# Patient Record
Sex: Female | Born: 1985 | Race: Black or African American | Hispanic: No | Marital: Single | Smoking: Current every day smoker
Health system: Southern US, Community
[De-identification: ages and names within clinical notes are randomized; demographics above are authoritative.]

## PROBLEM LIST (undated history)

## (undated) DIAGNOSIS — K529 Noninfective gastroenteritis and colitis, unspecified: Secondary | ICD-10-CM

---

## 2015-02-09 ENCOUNTER — Emergency Department (HOSPITAL_COMMUNITY)
Admission: EM | Admit: 2015-02-09 | Discharge: 2015-02-09 | Discharge status: Against Medical Advice | Payer: Medicaid Other | Attending: Emergency Medicine | Admitting: Emergency Medicine

## 2015-02-09 ENCOUNTER — Encounter (HOSPITAL_COMMUNITY): Payer: Self-pay | Admitting: Emergency Medicine

## 2015-02-09 DIAGNOSIS — R51 Headache: Secondary | ICD-10-CM | POA: Insufficient documentation

## 2015-02-09 DIAGNOSIS — Z720 Tobacco use: Secondary | ICD-10-CM | POA: Insufficient documentation

## 2015-02-09 LAB — CBC WITH DIFFERENTIAL/PLATELET
Basophils Absolute: 0 10*3/uL (ref 0.0–0.1)
Basophils Relative: 0 % (ref 0–1)
EOS ABS: 0.2 10*3/uL (ref 0.0–0.7)
EOS PCT: 2 % (ref 0–5)
HEMATOCRIT: 36.3 % (ref 36.0–46.0)
Hemoglobin: 12.6 g/dL (ref 12.0–15.0)
LYMPHS ABS: 2.7 10*3/uL (ref 0.7–4.0)
LYMPHS PCT: 36 % (ref 12–46)
MCH: 31.8 pg (ref 26.0–34.0)
MCHC: 34.7 g/dL (ref 30.0–36.0)
MCV: 91.7 fL (ref 78.0–100.0)
MONOS PCT: 5 % (ref 3–12)
Monocytes Absolute: 0.4 10*3/uL (ref 0.1–1.0)
Neutro Abs: 4.2 10*3/uL (ref 1.7–7.7)
Neutrophils Relative %: 57 % (ref 43–77)
Platelets: 242 10*3/uL (ref 150–400)
RBC: 3.96 MIL/uL (ref 3.87–5.11)
RDW: 12.2 % (ref 11.5–15.5)
WBC: 7.4 10*3/uL (ref 4.0–10.5)

## 2015-02-09 LAB — COMPREHENSIVE METABOLIC PANEL
ALT: 11 U/L — AB (ref 14–54)
AST: 19 U/L (ref 15–41)
Albumin: 3.5 g/dL (ref 3.5–5.0)
Alkaline Phosphatase: 54 U/L (ref 38–126)
Anion gap: 6 (ref 5–15)
BUN: 17 mg/dL (ref 6–20)
CALCIUM: 9.1 mg/dL (ref 8.9–10.3)
CO2: 26 mmol/L (ref 22–32)
CREATININE: 0.75 mg/dL (ref 0.44–1.00)
Chloride: 106 mmol/L (ref 101–111)
GFR calc Af Amer: >60 mL/min (ref >60)
Glucose, Bld: 119 mg/dL — ABNORMAL HIGH (ref 65–99)
Potassium: 3.7 mmol/L (ref 3.5–5.1)
SODIUM: 138 mmol/L (ref 135–145)
TOTAL PROTEIN: 6.7 g/dL (ref 6.5–8.1)
Total Bilirubin: 0.2 mg/dL — ABNORMAL LOW (ref 0.3–1.2)

## 2015-02-09 LAB — POC URINE PREG, ED: Preg Test, Ur: NEGATIVE

## 2015-02-09 NOTE — ED Notes (Signed)
Pt c/o headache x's 1 week with nausea no vomiting.  St's has taken Advil without relief

## 2015-05-05 ENCOUNTER — Encounter (HOSPITAL_COMMUNITY): Payer: Self-pay | Admitting: Emergency Medicine

## 2015-05-05 ENCOUNTER — Emergency Department (HOSPITAL_COMMUNITY)
Admission: EM | Admit: 2015-05-05 | Discharge: 2015-05-06 | Disposition: A | Discharge status: Home / Self Care | Payer: Medicaid Other | Attending: Emergency Medicine | Admitting: Emergency Medicine

## 2015-05-05 ENCOUNTER — Emergency Department (HOSPITAL_COMMUNITY): Payer: Medicaid Other

## 2015-05-05 DIAGNOSIS — R1013 Epigastric pain: Secondary | ICD-10-CM | POA: Insufficient documentation

## 2015-05-05 DIAGNOSIS — Z720 Tobacco use: Secondary | ICD-10-CM | POA: Diagnosis not present

## 2015-05-05 DIAGNOSIS — R0789 Other chest pain: Secondary | ICD-10-CM

## 2015-05-05 DIAGNOSIS — R079 Chest pain, unspecified: Secondary | ICD-10-CM | POA: Diagnosis present

## 2015-05-05 LAB — BASIC METABOLIC PANEL
ANION GAP: 8 (ref 5–15)
BUN: 13 mg/dL (ref 6–20)
CALCIUM: 9.8 mg/dL (ref 8.9–10.3)
CO2: 26 mmol/L (ref 22–32)
Chloride: 107 mmol/L (ref 101–111)
Creatinine, Ser: 0.83 mg/dL (ref 0.44–1.00)
GFR calc Af Amer: >60 mL/min (ref >60)
GFR calc non Af Amer: >60 mL/min (ref >60)
Glucose, Bld: 88 mg/dL (ref 65–99)
Potassium: 3.7 mmol/L (ref 3.5–5.1)
Sodium: 141 mmol/L (ref 135–145)

## 2015-05-05 LAB — I-STAT TROPONIN, ED: TROPONIN I, POC: 0 ng/mL (ref 0.00–0.08)

## 2015-05-05 LAB — CBC
HEMATOCRIT: 39.2 % (ref 36.0–46.0)
Hemoglobin: 13.4 g/dL (ref 12.0–15.0)
MCH: 32.2 pg (ref 26.0–34.0)
MCHC: 34.2 g/dL (ref 30.0–36.0)
MCV: 94.2 fL (ref 78.0–100.0)
Platelets: 211 10*3/uL (ref 150–400)
RBC: 4.16 MIL/uL (ref 3.87–5.11)
RDW: 12.2 % (ref 11.5–15.5)
WBC: 10.6 10*3/uL — ABNORMAL HIGH (ref 4.0–10.5)

## 2015-05-05 MED ORDER — KETOROLAC TROMETHAMINE 30 MG/ML IJ SOLN
30.0000 mg | Freq: Once | INTRAMUSCULAR | Status: AC
  Administered 2015-05-05: 30 mg via INTRAMUSCULAR
  Filled 2015-05-05: qty 1

## 2015-05-05 MED ORDER — GI COCKTAIL ~~LOC~~
30.0000 mL | Freq: Once | ORAL | Status: AC
  Administered 2015-05-05: 30 mL via ORAL
  Filled 2015-05-05: qty 30

## 2015-05-05 NOTE — ED Notes (Signed)
Pt. reports central chest pain " gurgling" onset today with mild SOB , denies nausea or diaphoresis . No cough or congestion , denies fever or chills.

## 2015-05-05 NOTE — ED Provider Notes (Signed)
CSN: [id number]     Arrival date & time 05/05/15  2136 History   First MD Initiated Contact with Patient 05/05/15 2303     Chief Complaint  Patient presents with  . Chest Pain     (Consider location/radiation/quality/duration/timing/severity/associated sxs/prior Treatment) HPI Comments: Pt. reports central chest pain " gurgling" onset today with mild SOB , denies nausea or diaphoresis . No cough or congestion , denies fever or chills. PERC negative. No early familial cardiac history.   Patient is a 29 y.o. female presenting with chest pain.  Chest Pain Pain location:  Epigastric Pain quality: tightness   Pain quality comment:  Gurgling Pain radiates to:  Does not radiate Pain radiates to the back: no   Timing:  Intermittent Progression:  Waxing and waning Chronicity:  New Relieved by:  None tried Worsened by:  Nothing tried Ineffective treatments:  None tried Associated symptoms: no abdominal pain, no fever, no lower extremity edema, no nausea, no syncope and not vomiting     History reviewed. No pertinent past medical history. No past surgical history on file. No family history on file. Social History  Substance Use Topics  . Smoking status: Current Every Day Smoker  . Smokeless tobacco: None  . Alcohol Use: Yes   OB History    No data available     Review of Systems  Constitutional: Negative for fever.  Cardiovascular: Positive for chest pain. Negative for syncope.  Gastrointestinal: Negative for nausea, vomiting and abdominal pain.  All other systems reviewed and are negative.     Allergies  Review of patient's allergies indicates no known allergies.  Home Medications   Prior to Admission medications   Medication Sig Start Date End Date Taking? Authorizing Minela [place]  ibuprofen (ADVIL,MOTRIN) 200 MG tablet Take 400-800 mg by mouth every 6 (six) hours as needed for headache or mild pain.   Yes Historical Elenora Hawbaker, MD  omeprazole (PRILOSEC) 20 MG capsule Take  1 capsule (20 mg total) by mouth daily. 05/06/15   Jennifer Piepenbrink, PA-C   BP 111/69 mmHg  Pulse 62  Temp(Src) 98 F (36.7 C) (Oral)  Resp 13  Ht  (1.803 m)  Wt 170 lb (77.111 kg)  BMI 23.72 kg/m2  SpO2 100%  LMP 03/31/2015 Physical Exam  Constitutional: She is oriented to person, place, and time. She appears well-developed and well-nourished. No distress.  HENT:  Head: Normocephalic and atraumatic.  Right Ear: External ear normal.  Left Ear: External ear normal.  Nose: Nose normal.  Mouth/Throat: Oropharynx is clear and moist.  Eyes: Conjunctivae are normal.  Neck: Normal range of motion. Neck supple.  No nuchal rigidity.   Cardiovascular: Normal rate, regular rhythm and normal heart sounds.   Pulmonary/Chest: Effort normal and breath sounds normal. No respiratory distress. She exhibits no tenderness.  Abdominal: Soft. There is no tenderness.  Musculoskeletal: Normal range of motion. She exhibits no edema.  Neurological: She is alert and oriented to person, place, and time.  Skin: Skin is warm and dry. She is not diaphoretic.  Psychiatric: She has a normal mood and affect.  Nursing note and vitals reviewed.   ED Course  Procedures (including critical care time) Medications  gi cocktail (Maalox,Lidocaine,Donnatal) (30 mLs Oral Given 05/05/15 2339)  ketorolac (TORADOL) 30 MG/ML injection 30 mg (30 mg Intramuscular Given 05/05/15 2340)    Labs Review Labs Reviewed  CBC - Abnormal; Notable for the following:    WBC 10.6 (*)    All other components within normal  limits  BASIC METABOLIC PANEL  I-STAT TROPOININ, ED    Imaging Review Dg Chest 2 View  05/05/2015   CLINICAL DATA:  Patient complains of tightness and a "gurgling" in the center of her chest that began this morning; she states she is unable to take in a full deep breath; smoker x 10 years; h/o bronchitis Pt states she has irregular periods; pt was double shielded  EXAM: CHEST  2 VIEW  COMPARISON:  None.   FINDINGS: The heart size and mediastinal contours are within normal limits. Both lungs are clear. The visualized skeletal structures are unremarkable.  IMPRESSION: No active cardiopulmonary disease.   Electronically Signed   By: Norva Pavlov M.D.   On: 05/05/2015 23:17   I have personally reviewed and evaluated these images and lab results as part of my medical decision-making.   EKG Interpretation   Date/Time:  Wednesday May 05 2015 21:41:54 EDT Ventricular Rate:  83 PR Interval:  142 QRS Duration: 80 QT Interval:  360 QTC Calculation: 423 R Axis:   85 Text Interpretation:  Normal sinus rhythm with sinus arrhythmia Normal ECG  No previous ECGs available Confirmed by YAO  MD, DAVID ([id number]) on  05/05/2015 10:49:51 PM      MDM   Final diagnoses:  Chest pain, atypical   Filed Vitals:   05/05/15 2330  BP: 111/69  Pulse: 62  Temp:   Resp: 13   Afebrile, NAD, non-toxic appearing, AAOx4.   Patient is to be discharged with recommendation to follow up with PCP in regards to today's hospital visit. Chest pain is not likely of cardiac or pulmonary etiology d/t presentation, perc negative, VSS, no tracheal deviation, no JVD or new murmur, RRR, breath sounds equal bilaterally, EKG without acute abnormalities, negative troponin, and negative CXR. Pt has been advised start a PPI and return to the ED is CP becomes exertional, associated with diaphoresis or nausea, radiates to left jaw/arm, worsens or becomes concerning in any way. Pt appears reliable for follow up and is agreeable to discharge.   Case has been discussed with  Dr. Jeraldine Loots who agrees with the above plan to discharge.       Francee Piccolo, PA-C 05/06/15 0109  Gerhard Munch, MD 05/06/15 [phone]

## 2015-05-06 MED ORDER — OMEPRAZOLE 20 MG PO CPDR: 20.0000 mg | DELAYED_RELEASE_CAPSULE | Freq: Every day | ORAL | Status: DC

## 2015-05-06 NOTE — ED Notes (Signed)
Pt. Left with all belongings and refused wheelchair. Discharge instructions were reviewed and all questions were answered.  

## 2015-05-06 NOTE — Discharge Instructions (Signed)
Please follow up with your primary care physician in 1-2 days. If you do not have one please call the [facility] number listed above. Please read all discharge instructions and return precautions.  ° ° °Chest Pain (Nonspecific) °It is often hard to give a specific diagnosis for the cause of chest pain. There is always a chance that your pain could be related to something serious, such as a heart attack or a blood clot in the lungs. You need to follow up with your health care provider for further evaluation. °CAUSES  °· Heartburn. °· Pneumonia or bronchitis. °· Anxiety or stress. °· Inflammation around your heart (pericarditis) or lung (pleuritis or pleurisy). °· A blood clot in the lung. °· A collapsed lung (pneumothorax). It can develop suddenly on its own (spontaneous pneumothorax) or from trauma to the chest. °· Shingles infection (herpes zoster virus). °The chest wall is composed of bones, muscles, and cartilage. Any of these can be the source of the pain. °· The bones can be bruised by injury. °· The muscles or cartilage can be strained by coughing or overwork. °· The cartilage can be affected by inflammation and become sore (costochondritis). °DIAGNOSIS  °Lab tests or other studies may be needed to find the cause of your pain. Your health care provider may have you take a test called an ambulatory electrocardiogram (ECG). An ECG records your heartbeat patterns over a 24-hour period. You may also have other tests, such as: °· Transthoracic echocardiogram (TTE). During echocardiography, sound waves are used to evaluate how blood flows through your heart. °· Transesophageal echocardiogram (TEE). °· Cardiac monitoring. This allows your health care provider to monitor your heart rate and rhythm in real time. °· Holter monitor. This is a portable device that records your heartbeat and can help diagnose heart arrhythmias. It allows your health care provider to track your heart activity for  several days, if needed. °· Stress tests by exercise or by giving medicine that makes the heart beat faster. °TREATMENT  °· Treatment depends on what may be causing your chest pain. Treatment may include: °¨ Acid blockers for heartburn. °¨ Anti-inflammatory medicine. °¨ Pain medicine for inflammatory conditions. °¨ Antibiotics if an infection is present. °· You may be advised to change lifestyle habits. This includes stopping smoking and avoiding alcohol, caffeine, and chocolate. °· You may be advised to keep your head raised (elevated) when sleeping. This reduces the chance of acid going backward from your stomach into your esophagus. °Most of the time, nonspecific chest pain will improve within 2-3 days with rest and mild pain medicine.  °HOME CARE INSTRUCTIONS  °· If antibiotics were prescribed, take them as directed. Finish them even if you start to feel better. °· For the next few days, avoid physical activities that bring on chest pain. Continue physical activities as directed. °· Do not use any tobacco products, including cigarettes, chewing tobacco, or electronic cigarettes. °· Avoid drinking alcohol. °· Only take medicine as directed by your health care provider. °· Follow your health care provider's suggestions for further testing if your chest pain does not go away. °· Keep any follow-up appointments you made. If you do not go to an appointment, you could develop lasting (chronic) problems with pain. If there is any problem keeping an appointment, call to reschedule. °SEEK MEDICAL CARE IF:  °· Your chest pain does not go away, even after treatment. °· You have a rash with blisters on your chest. °· You have a fever. °  SEEK IMMEDIATE MEDICAL CARE IF:  °· You have increased chest pain or pain that spreads to your arm, neck, jaw, back, or abdomen. °· You have shortness of breath. °· You have an increasing cough, or you cough up blood. °· You have severe back or abdominal pain. °· You feel nauseous or  vomit. °· You have severe weakness. °· You faint. °· You have chills. °This is an emergency. Do not wait to see if the pain will go away. Get medical help at once. Call your local emergency services (911 in [place] Do not drive yourself to the hospital. °MAKE SURE YOU:  °· Understand these instructions. °· Will watch your condition. °· Will get help right away if you are not doing well or get worse. °Document Released: 05/10/2005 Document Revised: 08/05/2013 Document Reviewed: 03/05/2008 °ExitCare® Patient Information ©2015 [employer]. This information is not intended to replace advice given to you by your health care provider. Make sure you discuss any questions you have with your health care provider. ° °

## 2015-12-10 ENCOUNTER — Encounter: Payer: Self-pay | Admitting: Podiatry

## 2015-12-10 ENCOUNTER — Ambulatory Visit (INDEPENDENT_AMBULATORY_CARE_PROVIDER_SITE_OTHER): Payer: Medicaid Other | Admitting: Podiatry

## 2015-12-10 VITALS — BP 111/70 | HR 76 | Resp 12

## 2015-12-10 DIAGNOSIS — S91209A Unspecified open wound of unspecified toe(s) with damage to nail, initial encounter: Secondary | ICD-10-CM

## 2015-12-10 DIAGNOSIS — S91301A Unspecified open wound, right foot, initial encounter: Secondary | ICD-10-CM

## 2015-12-10 NOTE — Progress Notes (Signed)
   Subjective:    Patient ID: Amy Case, female    DOB: 07-03-1986, 30 y.o.   MRN: [id number]  HPI this patient presents to the office with chief complaint of a deformed toenail on her right  foot. She says she was receiving treatment at a nail salon and following the treatment, the nail became discolored and has fallen off of her nail bed. She says it has regrown and become thick disfigured and she has even trimmed it herself. She is concerned about the fact the nail is unattached and does not appear normal. She presents the office today for an evaluation and treatment of this condition    Review of Systems  All other systems reviewed and are negative.      Objective:   Physical Exam GENERAL APPEARANCE: Alert, conversant. Appropriately groomed. No acute distress.  VASCULAR: Pedal pulses are  palpable at  [facility] bilateral.  Capillary refill time is immediate to all digits,  Normal temperature gradient.  Digital hair growth is present bilateral  NEUROLOGIC: sensation is normal to 5.07 monofilament at 5/5 sites bilateral.  Light touch is intact bilateral, Muscle strength normal.  MUSCULOSKELETAL: acceptable muscle strength, tone and stability bilateral.  Intrinsic muscluature intact bilateral.  Rectus appearance of foot and digits noted bilateral. Hammer toe with heloma durum right foot.   DERMATOLOGIC: skin color, texture, and turgor are within normal limits.  No preulcerative lesions or ulcers  are seen, no interdigital maceration noted.  No open lesions present.  Digital nails are asymptomatic. No drainage noted. Thick disfigured discolored unattached nail plate right hallux.         Assessment & Plan:  Nail trauma right hallux.   IE.  Discussed nail problem with patient.   To return prn   Helane GuntherGregory Mayer DPM

## 2016-02-05 ENCOUNTER — Encounter (HOSPITAL_COMMUNITY): Payer: Self-pay

## 2016-02-05 ENCOUNTER — Emergency Department (HOSPITAL_COMMUNITY)
Admission: EM | Admit: 2016-02-05 | Discharge: 2016-02-05 | Disposition: A | Discharge status: Home / Self Care | Payer: Medicaid Other | Attending: Emergency Medicine | Admitting: Emergency Medicine

## 2016-02-05 DIAGNOSIS — N390 Urinary tract infection, site not specified: Secondary | ICD-10-CM | POA: Diagnosis present

## 2016-02-05 DIAGNOSIS — F17200 Nicotine dependence, unspecified, uncomplicated: Secondary | ICD-10-CM | POA: Insufficient documentation

## 2016-02-05 DIAGNOSIS — B9689 Other specified bacterial agents as the cause of diseases classified elsewhere: Secondary | ICD-10-CM

## 2016-02-05 DIAGNOSIS — Z79899 Other long term (current) drug therapy: Secondary | ICD-10-CM | POA: Diagnosis not present

## 2016-02-05 DIAGNOSIS — N760 Acute vaginitis: Secondary | ICD-10-CM | POA: Diagnosis not present

## 2016-02-05 LAB — URINALYSIS, ROUTINE W REFLEX MICROSCOPIC
Bilirubin Urine: NEGATIVE
Glucose, UA: NEGATIVE mg/dL
Hgb urine dipstick: NEGATIVE
KETONES UR: NEGATIVE mg/dL
Nitrite: NEGATIVE
Protein, ur: NEGATIVE mg/dL
Specific Gravity, Urine: 1.034 — ABNORMAL HIGH (ref 1.005–1.030)
pH: 6 (ref 5.0–8.0)

## 2016-02-05 LAB — URINE MICROSCOPIC-ADD ON

## 2016-02-05 LAB — WET PREP, GENITAL
Sperm: NONE SEEN
TRICH WET PREP: NONE SEEN
YEAST WET PREP: NONE SEEN

## 2016-02-05 LAB — PREGNANCY, URINE: Preg Test, Ur: NEGATIVE

## 2016-02-05 MED ORDER — METRONIDAZOLE 500 MG PO TABS: 500.0000 mg | ORAL_TABLET | Freq: Two times a day (BID) | ORAL | Status: DC

## 2016-02-05 MED ORDER — NITROFURANTOIN MONOHYD MACRO 100 MG PO CAPS: 100.0000 mg | ORAL_CAPSULE | Freq: Two times a day (BID) | ORAL | Status: DC

## 2016-02-05 NOTE — ED Notes (Signed)
Pt states she believes she has UTI; Pt states symptoms started about a week ago. Pt denies pain on arrival. Pt a&ox 4 on arrival.

## 2016-02-05 NOTE — ED Provider Notes (Signed)
CSN: [id number]     Arrival date & time 02/05/16  2024 History   First MD Initiated Contact with Patient 02/05/16 2029     No chief complaint on file.    (Consider location/radiation/quality/duration/timing/severity/associated sxs/prior Treatment) HPI  30 year old female presents with urinary frequency over the past 1 week. No dysuria. No urgency. No abdominal pain or back pain. No nausea, vomiting, or fevers. Has not had an increase in discharge but has noticed that her discharge has an odor to it during this time. No missed periods.  No past medical history on file. No past surgical history on file. No family history on file. Social History  Substance Use Topics  . Smoking status: Current Every Day Smoker  . Smokeless tobacco: Not on file  . Alcohol Use: Yes   OB History    No data available     Review of Systems  Constitutional: Negative for fever.  Gastrointestinal: Negative for nausea, vomiting and abdominal pain.  Genitourinary: Positive for frequency and vaginal discharge. Negative for dysuria.  Musculoskeletal: Negative for back pain.  All other systems reviewed and are negative.     Allergies  Review of patient's allergies indicates no known allergies.  Home Medications   Prior to Admission medications   Medication Sig Start Date End Date Taking? Authorizing Provider  ibuprofen (ADVIL,MOTRIN) 200 MG tablet Take 400-800 mg by mouth every 6 (six) hours as needed for headache or mild pain.    Historical Provider, MD  omeprazole (PRILOSEC) 20 MG capsule Take 1 capsule (20 mg total) by mouth daily. 05/06/15   Francee PiccoloJennifer Piepenbrink, PA-C   There were no vitals taken for this visit. Physical Exam  Constitutional: She is oriented to person, place, and time. She appears well-developed and well-nourished. No distress.  HENT:  Head: Normocephalic and atraumatic.  Right Ear: External ear normal.  Left Ear: External ear normal.  Nose: Nose normal.  Eyes: Right eye  exhibits no discharge. Left eye exhibits no discharge.  Cardiovascular: Normal rate, regular rhythm and normal heart sounds.   Pulmonary/Chest: Effort normal and breath sounds normal.  Abdominal: Soft. She exhibits no distension. There is no tenderness. There is no CVA tenderness.  Genitourinary: Uterus is not enlarged and not tender. Cervix exhibits no motion tenderness. Right adnexum displays no mass and no tenderness. Left adnexum displays no mass and no tenderness. No bleeding in the vagina. Vaginal discharge (scant) found.  Neurological: She is alert and oriented to person, place, and time.  Skin: Skin is warm and dry. She is not diaphoretic.  Nursing note and vitals reviewed.   ED Course  Procedures (including critical care time) Labs Review Labs Reviewed  WET PREP, GENITAL - Abnormal; Notable for the following:    Clue Cells Wet Prep HPF POC PRESENT (*)    WBC, Wet Prep HPF POC MANY (*)    All other components within normal limits  URINALYSIS, ROUTINE W REFLEX MICROSCOPIC (NOT AT [facility]) - Abnormal; Notable for the following:    APPearance CLOUDY (*)    Specific Gravity, Urine 1.034 (*)    Leukocytes, UA SMALL (*)    All other components within normal limits  URINE MICROSCOPIC-ADD ON - Abnormal; Notable for the following:    Squamous Epithelial / LPF 6-30 (*)    Bacteria, UA RARE (*)    All other components within normal limits  PREGNANCY, URINE  POC URINE PREG, ED  GC/CHLAMYDIA PROBE AMP ([place]) NOT AT [facility]    Imaging Review No  results found. I have personally reviewed and evaluated these images and lab results as part of my medical decision-making.   EKG Interpretation None      MDM   Final diagnoses:  Acute lower UTI  Bacterial vaginosis    Patient with symptoms c/w UTI, will treat with macrobid. No signs/symptoms of more severe disease or sepsis. Has some discharge with change in odor, will treat for possible BV. She has no concern for STI, thus will not  treat for GC/Chlamydia and send for PCR. Discussed return precautions.    Pricilla LovelessScott Dequavius Kuhner, MD 02/06/16 0000

## 2016-02-05 NOTE — ED Notes (Signed)
Pelvic cart at bedside. 

## 2016-02-05 NOTE — ED Notes (Signed)
MD at bedside. 

## 2016-02-07 LAB — GC/CHLAMYDIA PROBE AMP (~~LOC~~) NOT AT ARMC
Chlamydia: POSITIVE — AB
Neisseria Gonorrhea: NEGATIVE

## 2016-02-08 ENCOUNTER — Telehealth (HOSPITAL_BASED_OUTPATIENT_CLINIC_OR_DEPARTMENT_OTHER): Payer: Self-pay | Admitting: Emergency Medicine

## 2016-02-08 NOTE — Telephone Encounter (Signed)
Chart handoff to EDP for treatment plan for + Chlamydia 

## 2016-02-10 ENCOUNTER — Telehealth (HOSPITAL_BASED_OUTPATIENT_CLINIC_OR_DEPARTMENT_OTHER): Payer: Self-pay | Admitting: Emergency Medicine

## 2016-02-10 NOTE — Telephone Encounter (Signed)
Post ED Visit - Positive Culture Follow-up: Successful Patient Follow-Up  Culture assessed and recommendations reviewed by: []  Enzo BiNathan Batchelder, Pharm.D. []  Celedonio MiyamotoJeremy Frens, Pharm.D., BCPS []  Garvin FilaMike Maccia, Pharm.D. []  Georgina PillionElizabeth Martin, [address]Ph[address]D., BCPS []  WitheeMinh Pham, [address]Pharm[address]D., BCPS, AAHIVP []  Estella HuskMichelle Turner, Pharm.D., BCPS, AAHIVP []  Tennis Mustassie Stewart, Pharm.D. []  Sherle Poeob Vincent, [address]Ph[address]  Positive chlamydia culture  [x]  Patient discharged without antimicrobial prescription and treatment is now indicated []  Organism is resistant to prescribed ED discharge antimicrobial []  Patient with positive blood cultures  Changes discussed with ED provider: Eyvonne MechanicJeffrey Hedges PA New antibiotic prescription Zithromax 1 gram po x 1 dose [facility]   Berle MullMiller, Fedor Kazmierski 02/10/2016, 3:59 PM

## 2016-09-19 ENCOUNTER — Ambulatory Visit (HOSPITAL_COMMUNITY)
Admission: EM | Admit: 2016-09-19 | Discharge: 2016-09-19 | Disposition: A | Discharge status: Home / Self Care | Payer: Medicaid Other | Attending: Family Medicine | Admitting: Family Medicine

## 2016-09-19 ENCOUNTER — Ambulatory Visit (INDEPENDENT_AMBULATORY_CARE_PROVIDER_SITE_OTHER): Payer: Medicaid Other

## 2016-09-19 DIAGNOSIS — W19XXXA Unspecified fall, initial encounter: Secondary | ICD-10-CM | POA: Diagnosis not present

## 2016-09-19 DIAGNOSIS — S40011A Contusion of right shoulder, initial encounter: Secondary | ICD-10-CM | POA: Diagnosis not present

## 2016-09-19 DIAGNOSIS — M7580 Other shoulder lesions, unspecified shoulder: Secondary | ICD-10-CM | POA: Diagnosis not present

## 2016-09-19 MED ORDER — NAPROXEN 500 MG PO TABS: 500.0000 mg | ORAL_TABLET | Freq: Two times a day (BID) | ORAL | 0 refills | Status: DC

## 2016-09-19 NOTE — ED Provider Notes (Signed)
CSN: [id number]     Arrival date & time 09/19/16  1012 History   First MD Initiated Contact with Patient 09/19/16 1116     Chief Complaint  Patient presents with  . Shoulder Injury   (Consider location/radiation/quality/duration/timing/severity/associated sxs/prior Treatment) HPI  No past medical history on file. No past surgical history on file. No family history on file. Social History  Substance Use Topics  . Smoking status: Current Every Day Smoker  . Smokeless tobacco: Not on file  . Alcohol use Yes   OB History    No data available     Review of Systems  Constitutional: Negative.   HENT: Negative.   Eyes: Negative.   Respiratory: Negative.   Cardiovascular: Negative.   Gastrointestinal: Negative.   Endocrine: Negative.   Genitourinary: Negative.   Musculoskeletal: Positive for arthralgias.  Neurological: Negative.     Allergies  Patient has no known allergies.  Home Medications   Prior to Admission medications   Medication Sig Start Date End Date Taking? Authorizing Provider  ibuprofen (ADVIL,MOTRIN) 200 MG tablet Take 400-800 mg by mouth every 6 (six) hours as needed for headache or mild pain.    Historical Provider, MD  metroNIDAZOLE (FLAGYL) 500 MG tablet Take 1 tablet (500 mg total) by mouth 2 (two) times daily. One po bid x 7 days 02/05/16   Pricilla LovelessScott Goldston, MD  naproxen (NAPROSYN) 500 MG tablet Take 1 tablet (500 mg total) by mouth 2 (two) times daily with a meal. 09/19/16   Deatra CanterWilliam J Anola Mcgough, FNP  nitrofurantoin, macrocrystal-monohydrate, (MACROBID) 100 MG capsule Take 1 capsule (100 mg total) by mouth 2 (two) times daily. 02/05/16   Pricilla LovelessScott Goldston, MD   Meds Ordered and Administered this Visit  Medications - No data to display  BP 119/77 (BP Location: Left Arm)   Pulse 75   Temp 98.5 F (36.9 C) (Oral)   Resp 16   SpO2 98%  No data found.   Physical Exam  Constitutional: She appears well-developed and well-nourished.  HENT:  Head: Normocephalic  and atraumatic.  Eyes: Conjunctivae and EOM are normal. Pupils are equal, round, and reactive to light.  Neck: Normal range of motion. Neck supple.  Cardiovascular: Normal rate, regular rhythm and normal heart sounds.   Pulmonary/Chest: Effort normal and breath sounds normal.  Musculoskeletal: She exhibits tenderness.  TTP right deltoid.  Tenderness with active /passive rom right shoulder.  No swelling or deformity  Nursing note and vitals reviewed.   Urgent Care Course     Procedures (including critical care time)  Labs Review Labs Reviewed - No data to display  Imaging Review Dg Shoulder Right  Result Date: 09/19/2016 CLINICAL DATA:  Larey SeatFell last night with right shoulder pain EXAM: RIGHT SHOULDER - 2+ VIEW COMPARISON:  None. FINDINGS: The right humeral head is in normal position and the glenohumeral joint space appears normal. The right AC joint is normally aligned. The right ribs that are visualized are intact. IMPRESSION: Negative. Electronically Signed   By: Dwyane DeePaul  Barry M.D.   On: 09/19/2016 11:37     Visual Acuity Review  Right Eye Distance:   Left Eye Distance:   Bilateral Distance:    Right Eye Near:   Left Eye Near:    Bilateral Near:         MDM   1. Contusion of right shoulder, initial encounter   2. Fall, initial encounter   3. Rotator cuff tendinitis, unspecified laterality    Naprosyn 500mg  one po bid x  10 days #20 Sling right arm  Work note 2 days      Deatra Canter, FNP 09/19/16 1226    Anselm Pancoast [place], [place] 09/19/16 1228

## 2016-09-19 NOTE — ED Triage Notes (Signed)
C/o right shoulder pain States she fell out of the bed last night

## 2016-12-26 ENCOUNTER — Emergency Department (HOSPITAL_COMMUNITY)
Admission: EM | Admit: 2016-12-26 | Discharge: 2016-12-26 | Disposition: A | Discharge status: Home / Self Care | Payer: Self-pay | Attending: Emergency Medicine | Admitting: Emergency Medicine

## 2016-12-26 ENCOUNTER — Emergency Department (HOSPITAL_COMMUNITY): Payer: Self-pay

## 2016-12-26 ENCOUNTER — Encounter (HOSPITAL_COMMUNITY): Payer: Self-pay | Admitting: *Deleted

## 2016-12-26 DIAGNOSIS — Z79899 Other long term (current) drug therapy: Secondary | ICD-10-CM | POA: Insufficient documentation

## 2016-12-26 DIAGNOSIS — R42 Dizziness and giddiness: Secondary | ICD-10-CM | POA: Insufficient documentation

## 2016-12-26 DIAGNOSIS — F17200 Nicotine dependence, unspecified, uncomplicated: Secondary | ICD-10-CM | POA: Insufficient documentation

## 2016-12-26 DIAGNOSIS — R519 Headache, unspecified: Secondary | ICD-10-CM

## 2016-12-26 DIAGNOSIS — R51 Headache: Secondary | ICD-10-CM | POA: Insufficient documentation

## 2016-12-26 LAB — CBC
HCT: 36.6 % (ref 36.0–46.0)
HEMOGLOBIN: 12.1 g/dL (ref 12.0–15.0)
MCH: 30.5 pg (ref 26.0–34.0)
MCHC: 33.1 g/dL (ref 30.0–36.0)
MCV: 92.2 fL (ref 78.0–100.0)
PLATELETS: 253 10*3/uL (ref 150–400)
RBC: 3.97 MIL/uL (ref 3.87–5.11)
RDW: 12.6 % (ref 11.5–15.5)
WBC: 7.9 10*3/uL (ref 4.0–10.5)

## 2016-12-26 LAB — I-STAT CHEM 8, ED
BUN: 14 mg/dL (ref 6–20)
CHLORIDE: 106 mmol/L (ref 101–111)
Calcium, Ion: 1.14 mmol/L — ABNORMAL LOW (ref 1.15–1.40)
Creatinine, Ser: 0.7 mg/dL (ref 0.44–1.00)
GLUCOSE: 104 mg/dL — AB (ref 65–99)
HCT: 37 % (ref 36.0–46.0)
HEMOGLOBIN: 12.6 g/dL (ref 12.0–15.0)
POTASSIUM: 3.7 mmol/L (ref 3.5–5.1)
SODIUM: 140 mmol/L (ref 135–145)
TCO2: 25 mmol/L (ref 0–100)

## 2016-12-26 LAB — I-STAT BETA HCG BLOOD, ED (MC, WL, AP ONLY): I-stat hCG, quantitative: <5 m[IU]/mL (ref <5)

## 2016-12-26 MED ORDER — FLUORESCEIN SODIUM 0.6 MG OP STRP
1.0000 | ORAL_STRIP | Freq: Once | OPHTHALMIC | Status: AC
  Administered 2016-12-26: 1 via OPHTHALMIC
  Filled 2016-12-26: qty 1

## 2016-12-26 MED ORDER — SODIUM CHLORIDE 0.9 % IV BOLUS (SEPSIS)
1000.0000 mL | Freq: Once | INTRAVENOUS | Status: AC
Start: 2016-12-26 — End: 2016-12-26
  Administered 2016-12-26: 1000 mL via INTRAVENOUS

## 2016-12-26 MED ORDER — DIPHENHYDRAMINE HCL 50 MG/ML IJ SOLN
50.0000 mg | Freq: Once | INTRAMUSCULAR | Status: AC
Start: 2016-12-26 — End: 2016-12-26
  Administered 2016-12-26: 50 mg via INTRAVENOUS
  Filled 2016-12-26: qty 1

## 2016-12-26 MED ORDER — METOCLOPRAMIDE HCL 5 MG/ML IJ SOLN
10.0000 mg | Freq: Once | INTRAMUSCULAR | Status: AC
  Administered 2016-12-26: 10 mg via INTRAVENOUS
  Filled 2016-12-26: qty 2

## 2016-12-26 MED ORDER — TETRACAINE HCL 0.5 % OP SOLN
2.0000 [drp] | Freq: Once | OPHTHALMIC | Status: AC
  Administered 2016-12-26: 2 [drp] via OPHTHALMIC
  Filled 2016-12-26: qty 2

## 2016-12-26 MED ORDER — METOCLOPRAMIDE HCL 10 MG PO TABS: 10.0000 mg | ORAL_TABLET | Freq: Three times a day (TID) | ORAL | 0 refills | Status: DC | PRN

## 2016-12-26 NOTE — ED Notes (Signed)
Patient transported to CT 

## 2016-12-26 NOTE — ED Notes (Signed)
EKG given to Dr. Yelverton 

## 2016-12-26 NOTE — Discharge Instructions (Signed)
call or return to have any questions, new symptoms, change in symptoms, or symptoms that you  do not understand. ° °

## 2016-12-26 NOTE — ED Triage Notes (Signed)
Pt reports onset at 5:45am of headache with dizziness, blurred vision and nausea.

## 2016-12-26 NOTE — ED Provider Notes (Signed)
[facility]-EMERGENCY DEPT Provider Note   CSN: [id number] Arrival date & time: 12/26/16  0709     History   Chief Complaint Chief Complaint  Patient presents with  . Headache  . Dizziness    HPI Amy Case is a 31 y.o. female.  The history is provided by the patient.  Headache   This is a new problem. The current episode started 3 to 5 hours ago (started around 5:45am). 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 Pain location: frontal, centered behind bilateral eyes. The quality of the pain is described as dull and throbbing. The pain is severe. The pain does not radiate. Associated symptoms include nausea. Pertinent negatives include no fever, no chest pressure, no syncope, no shortness of breath and no vomiting. Associated symptoms comments: "clammy", blurry vision worse over left . She has tried nothing for the symptoms. The treatment provided no relief.    History reviewed. No pertinent past medical history.  There are no active problems to display for this patient.   History reviewed. No pertinent surgical history.  OB History    No data available       Home Medications    Prior to Admission medications   Medication Sig Start Date End Date Taking? Authorizing Provider  acetaminophen (TYLENOL) 325 MG tablet Take 650 mg by mouth every 6 (six) hours as needed for mild pain.   Yes [provider]  cyclobenzaprine (FLEXERIL) 10 MG tablet Take 10 mg by mouth 3 (three) times daily as needed for muscle spasms.   Yes [provider]  ibuprofen (ADVIL,MOTRIN) 200 MG tablet Take 400-800 mg by mouth every 6 (six) hours as needed for headache or mild pain.   Yes [provider]  metoCLOPramide (REGLAN) 10 MG tablet Take 1 tablet (10 mg total) by mouth every 8 (eight) hours as needed for nausea (headache). 12/26/16   Marijean Niemann, MD  metroNIDAZOLE (FLAGYL) 500 MG tablet Take 1 tablet  (500 mg total) by mouth 2 (two) times daily. One po bid x 7 days Patient not taking: Reported on 12/26/2016 02/05/16   Pricilla Loveless, MD  naproxen (NAPROSYN) 500 MG tablet Take 1 tablet (500 mg total) by mouth 2 (two) times daily with a meal. Patient not taking: Reported on 12/26/2016 09/19/16   Deatra Canter, FNP  nitrofurantoin, macrocrystal-monohydrate, (MACROBID) 100 MG capsule Take 1 capsule (100 mg total) by mouth 2 (two) times daily. Patient not taking: Reported on 12/26/2016 02/05/16   Pricilla Loveless, MD    Family History History reviewed. No pertinent family history.  Social History Social History  Substance Use Topics  . Smoking status: Current Every Day Smoker  . Smokeless tobacco: Not on file  . Alcohol use Yes     Allergies   Patient has no known allergies.   Review of Systems Review of Systems  Constitutional: Negative for fever.  HENT: Negative for facial swelling and trouble swallowing.   Eyes: Positive for photophobia and visual disturbance.  Respiratory: Negative for cough and shortness of breath.   Cardiovascular: Negative for chest pain and syncope.  Gastrointestinal: Positive for nausea. Negative for abdominal pain and vomiting.  Genitourinary: Positive for vaginal bleeding (on period now starting 3 days ago).  Musculoskeletal: Negative for neck pain and neck stiffness.  Skin: Negative.   Neurological: Positive for light-headedness and headaches.  All other systems reviewed and are negative.    Physical Exam Updated Vital Signs  BP 112/74   Pulse 79   Temp 98 F (36.7 C) (Oral)   Resp 12   Ht 5\' 11"  (1.803 m)   Wt 77.1 kg   LMP 12/23/2016   SpO2 98%   BMI 23.71 kg/m   Physical Exam  Constitutional: She is oriented to person, place, and time. She appears well-developed and well-nourished. No distress.  HENT:  Head: Normocephalic and atraumatic.  Mouth/Throat: Oropharynx is clear and moist.  Eyes: Conjunctivae and EOM are normal. Pupils are  equal, round, and reactive to light.  Photophobia present with light to both eyes Visual fields intact Left eye pressure 10 and 9 Right eye pressure 11 and 12 No corneal defects on staining  Neck: Normal range of motion. Neck supple.  Cardiovascular: Normal rate, regular rhythm and normal heart sounds.   No murmur heard. Pulmonary/Chest: Effort normal and breath sounds normal. No respiratory distress.  Abdominal: Soft. There is no tenderness. There is no guarding.  Musculoskeletal: She exhibits no edema or deformity.  Neurological: She is alert and oriented to person, place, and time. She has normal strength. No cranial nerve deficit or sensory deficit. Coordination normal. GCS eye subscore is 4. GCS verbal subscore is 5. GCS motor subscore is 6.  Skin: Skin is warm and dry.  Psychiatric: She has a normal mood and affect.  Nursing note and vitals reviewed.    ED Treatments / Results  Labs (all labs ordered are listed, but only abnormal results are displayed) Labs Reviewed  I-STAT CHEM 8, ED - Abnormal; Notable for the following:       Result Value   Glucose, Bld 104 (*)    Calcium, Ion 1.14 (*)    All other components within normal limits  CBC  I-STAT BETA HCG BLOOD, ED ([facility], [facility], [facility] ONLY)    EKG  EKG Interpretation  Date/Time:  Tuesday Dec 26 2016 08:18:31 EDT Ventricular Rate:  74 PR Interval:    QRS Duration: 77 QT Interval:  374 QTC Calculation: 415 R Axis:   83 Text Interpretation:  Sinus arrhythmia ST elev, probable normal early repol pattern Confirmed by YELVERTON  MD, DAVID ([id number]) on 12/26/2016 9:12:41 AM       Radiology Ct Head Wo Contrast  Result Date: 12/26/2016 CLINICAL DATA:  Dizziness and right-sided headache EXAM: CT HEAD WITHOUT CONTRAST TECHNIQUE: Contiguous axial images were obtained from the base of the skull through the vertex without intravenous contrast. COMPARISON:  None. FINDINGS: Brain: The ventricles are normal in size and configuration. There  is no intracranial mass, hemorrhage, extra-axial fluid collection, or midline shift. Gray-white compartments appear normal. No evident acute infarct. Vascular: There is no hyperdense vessel. No abnormal calcification evident. Skull: Bony calvarium appears intact. Sinuses/Orbits: Visualized paranasal sinuses are clear. Note that frontal sinuses are aplastic. Orbits appear symmetric bilaterally. Other: Mastoid air cells are clear. IMPRESSION: Study within normal limits. Electronically Signed   By: Bretta Bang III M.D.   On: 12/26/2016 08:51    Procedures Procedures (including critical care time)  Medications Ordered in ED Medications  sodium chloride 0.9 % bolus 1,000 mL (0 mLs Intravenous Stopped 12/26/16 1008)  diphenhydrAMINE (BENADRYL) injection 50 mg (50 mg Intravenous Given 12/26/16 0830)  metoCLOPramide (REGLAN) injection 10 mg (10 mg Intravenous Given 12/26/16 0831)  tetracaine (PONTOCAINE) 0.5 % ophthalmic solution 2 drop (2 drops Both Eyes Given by Other 12/26/16 0820)  fluorescein ophthalmic strip 1 strip (1 strip Both Eyes Given by Other 12/26/16 0819)  Initial Impression / Assessment and Plan / ED Course  I have reviewed the triage vital signs and the nursing notes.  Pertinent labs & imaging results that were available during my care of the patient were reviewed by me and considered in my medical decision making (see chart for details).     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 No associated chest pain, shortness of breath, but she does report nausea. No history of headaches like this in the past. Not tried anything for improvement. Further history and exam as above with reassuring vitals and no significant finding on neuro exam. Doubt acute infective pathology. Eye exam wnl. Head ct  without acute findings. Ha much improved with treatment here. Pt ambulatory with normal neuro exam and gait on repeat exam.   I have reviewed all imaging. Patient stable for discharge home.  I have reviewed all results with the patient. Advised to cont otc pain medications and will rx reglan. Patient agrees to stated plan. All questions answered. Advised to call or return to have any questions, new symptoms, change in symptoms, or symptoms that they do not understand.   Final Clinical Impressions(s) / ED Diagnoses   Final diagnoses:  Lightheadedness  Acute nonintractable headache, unspecified headache type    New Prescriptions Discharge Medication List as of 12/26/2016  9:58 AM    START taking these medications   Details  metoCLOPramide (REGLAN) 10 MG tablet Take 1 tablet (10 mg total) by mouth every 8 (eight) hours as needed for nausea (headache)., Starting Tue 12/26/2016, Print         Marijean NiemannWoodrum, Kaylan Yates, MD 12/27/16 [id number]    Loren RacerYelverton, David, MD 01/01/17 [phone]

## 2016-12-28 ENCOUNTER — Encounter (HOSPITAL_COMMUNITY): Payer: Self-pay | Admitting: Emergency Medicine

## 2016-12-28 ENCOUNTER — Emergency Department (HOSPITAL_COMMUNITY)
Admission: EM | Admit: 2016-12-28 | Discharge: 2016-12-29 | Disposition: A | Discharge status: Home / Self Care | Payer: Medicaid Other | Attending: Dermatology | Admitting: Dermatology

## 2016-12-28 DIAGNOSIS — R51 Headache: Secondary | ICD-10-CM | POA: Insufficient documentation

## 2016-12-28 DIAGNOSIS — Z5321 Procedure and treatment not carried out due to patient leaving prior to being seen by health care provider: Secondary | ICD-10-CM | POA: Insufficient documentation

## 2016-12-28 NOTE — ED Triage Notes (Signed)
Pt presents to ED for assessment of returning headache from visit on 5/15.  States she had improved symptoms after Reglan and BEnadryl, but headache came back this morning approx 3am, with same left eye blurred vision.  Pt c/o some nausea and light/sound sensitivity.

## 2016-12-29 NOTE — ED Notes (Signed)
Patient did not answer when called for rooming.   

## 2017-07-29 ENCOUNTER — Emergency Department (HOSPITAL_COMMUNITY)
Admission: EM | Admit: 2017-07-29 | Discharge: 2017-07-29 | Disposition: A | Discharge status: Home / Self Care | Payer: Self-pay | Attending: Emergency Medicine | Admitting: Emergency Medicine

## 2017-07-29 ENCOUNTER — Encounter (HOSPITAL_COMMUNITY): Payer: Self-pay | Admitting: Emergency Medicine

## 2017-07-29 ENCOUNTER — Other Ambulatory Visit: Payer: Self-pay

## 2017-07-29 DIAGNOSIS — F17210 Nicotine dependence, cigarettes, uncomplicated: Secondary | ICD-10-CM | POA: Insufficient documentation

## 2017-07-29 DIAGNOSIS — Z79899 Other long term (current) drug therapy: Secondary | ICD-10-CM | POA: Insufficient documentation

## 2017-07-29 DIAGNOSIS — J40 Bronchitis, not specified as acute or chronic: Secondary | ICD-10-CM | POA: Insufficient documentation

## 2017-07-29 MED ORDER — ALBUTEROL SULFATE HFA 108 (90 BASE) MCG/ACT IN AERS
2.0000 | INHALATION_SPRAY | RESPIRATORY_TRACT | Status: DC | PRN
  Administered 2017-07-29: 2 via RESPIRATORY_TRACT
  Filled 2017-07-29 (×2): qty 6.7

## 2017-07-29 MED ORDER — AEROCHAMBER PLUS FLO-VU MEDIUM MISC
1.0000 | Freq: Once | Status: AC
  Administered 2017-07-29: 1
  Filled 2017-07-29: qty 1

## 2017-07-29 NOTE — ED Provider Notes (Signed)
[facility] EMERGENCY DEPARTMENT Provider Note   CSN: [id number] Arrival date & time: 07/29/17  1258     History   Chief Complaint Chief Complaint  Patient presents with  . Chest Pain    HPI Amy Case is a 31 y.o. female.  HPI  31 year old otherwise healthy female smoker presents today with nasal congestion, cough, and cold symptoms that began several days ago.  She has begun to have some increased cough and with this has some associated anterior chest tightness.  This is worse with coughing.  She has some production.  She has a 66 year old daughter that has had similar symptoms.  She has had some subjective fever.  She denies any chills.  She endorses some nausea but has not been vomiting and has been able to take p.o. without difficulty.  She has continued to smoke this is not change her symptoms.  She has had no worsening of symptoms with exertion.  She denies any history of cardiac problems.  She denies any risk factors for DVT or PE.  History reviewed. No pertinent past medical history.  There are no active problems to display for this patient.   No past surgical history on file.  OB History    No data available       Home Medications    Prior to Admission medications   Medication Sig Start Date End Date Taking? Authorizing Provider  acetaminophen (TYLENOL) 325 MG tablet Take 650 mg by mouth every 6 (six) hours as needed for mild pain.    [provider]  cyclobenzaprine (FLEXERIL) 10 MG tablet Take 10 mg by mouth 3 (three) times daily as needed for muscle spasms.    [provider]  ibuprofen (ADVIL,MOTRIN) 200 MG tablet Take 400-800 mg by mouth every 6 (six) hours as needed for headache or mild pain.    [provider]  metoCLOPramide (REGLAN) 10 MG tablet Take 1 tablet (10 mg total) by mouth every 8 (eight) hours as needed for nausea (headache). 12/26/16   Marijean Niemann, MD  metroNIDAZOLE (FLAGYL) 500 MG tablet  Take 1 tablet (500 mg total) by mouth 2 (two) times daily. One po bid x 7 days Patient not taking: Reported on 12/26/2016 02/05/16   Pricilla Loveless, MD  naproxen (NAPROSYN) 500 MG tablet Take 1 tablet (500 mg total) by mouth 2 (two) times daily with a meal. Patient not taking: Reported on 12/26/2016 09/19/16   Deatra Canter, FNP  nitrofurantoin, macrocrystal-monohydrate, (MACROBID) 100 MG capsule Take 1 capsule (100 mg total) by mouth 2 (two) times daily. Patient not taking: Reported on 12/26/2016 02/05/16   Pricilla Loveless, MD    Family History No family history on file.  Social History Social History   Tobacco Use  . Smoking status: Current Every Day Smoker  . Smokeless tobacco: Never Used  Substance Use Topics  . Alcohol use: Yes  . Drug use: No     Allergies   Patient has no known allergies.   Review of Systems Review of Systems  All other systems reviewed and are negative.    Physical Exam Updated Vital Signs BP 112/85   Pulse 94   Temp 98 F (36.7 C)   Resp 18   LMP 07/10/2017   SpO2 100%   Physical Exam  Constitutional: She is oriented to person, place, and time. She appears well-developed and well-nourished.  HENT:  Head: Normocephalic.  Eyes: EOM are normal. Pupils are equal, round, and reactive to  light.  Neck: Normal range of motion.  Cardiovascular: Normal rate and regular rhythm. Exam reveals no gallop and no friction rub.  No murmur heard.  No systolic murmur is present. Pulmonary/Chest: Effort normal. She has decreased breath sounds in the right lower field and the left lower field. She has wheezes. She has no rhonchi. She has no rales.  Some coughing on exam with few expiratory wheezes  Abdominal: Soft. Bowel sounds are normal.  Musculoskeletal: Normal range of motion.  Neurological: She is alert and oriented to person, place, and time.  Psychiatric: She has a normal mood and affect. Her behavior is normal.  Nursing note and vitals  reviewed.    ED Treatments / Results  Labs (all labs ordered are listed, but only abnormal results are displayed) Labs Reviewed - No data to display  EKG  EKG Interpretation None       Radiology No results found.  Procedures Procedures (including critical care time)  Medications Ordered in ED Medications - No data to display   Initial Impression / Assessment and Plan / ED Course  I have reviewed the triage vital signs and the nursing notes.  Pertinent labs & imaging results that were available during my care of the patient were reviewed by me and considered in my medical decision making (see chart for details).     Well-developed well-nourished female with URI symptoms.  She has a few expiratory wheezes on exam and feels some associated chest tightness.  EKG shows no evidence of acute ischemia.  Given her concurrent URI symptoms and tightness appears to be associated with wheezing, I do not think further cardiac evaluation is needed at this time.  I discussed return precautions and need for close follow-up with patient and she voices understanding.  She is given albuterol HFA here in the emergency department.  He appears to be in no respiratory distress. She has been counseled regarding smoking cessation. Final Clinical Impressions(s) / ED Diagnoses   Final diagnoses:  Bronchitis    ED Discharge Orders    None       Margarita Grizzleay, Reyanna Baley, MD 07/29/17 [phone]

## 2017-07-29 NOTE — ED Notes (Signed)
Declined W/C at D/C and was escorted to lobby by RN. 

## 2017-07-29 NOTE — ED Triage Notes (Signed)
Pt states cough with congestion since Friday states this morning began having chest pain with some shortness of breath. LMP last month and normal.

## 2019-08-29 ENCOUNTER — Ambulatory Visit: Payer: Medicaid Other | Attending: Internal Medicine

## 2019-08-29 DIAGNOSIS — Z20822 Contact with and (suspected) exposure to covid-19: Secondary | ICD-10-CM

## 2019-08-30 LAB — NOVEL CORONAVIRUS, NAA: SARS-CoV-2, NAA: NOT DETECTED

## 2019-11-28 ENCOUNTER — Ambulatory Visit: Payer: Medicaid Other | Admitting: Neurology

## 2019-12-18 ENCOUNTER — Ambulatory Visit: Payer: Medicaid Other | Admitting: Neurology

## 2020-01-07 ENCOUNTER — Encounter: Payer: Medicaid Other | Admitting: Neurology

## 2020-03-22 ENCOUNTER — Other Ambulatory Visit: Payer: Medicaid Other

## 2020-05-19 ENCOUNTER — Encounter (HOSPITAL_COMMUNITY): Payer: Self-pay

## 2020-05-19 ENCOUNTER — Emergency Department (HOSPITAL_COMMUNITY)
Admission: EM | Admit: 2020-05-19 | Discharge: 2020-05-20 | Disposition: A | Discharge status: Home / Self Care | Payer: Medicaid Other | Attending: Emergency Medicine | Admitting: Emergency Medicine

## 2020-05-19 ENCOUNTER — Other Ambulatory Visit: Payer: Self-pay

## 2020-05-19 DIAGNOSIS — F17200 Nicotine dependence, unspecified, uncomplicated: Secondary | ICD-10-CM | POA: Diagnosis not present

## 2020-05-19 DIAGNOSIS — R1033 Periumbilical pain: Secondary | ICD-10-CM | POA: Insufficient documentation

## 2020-05-19 DIAGNOSIS — R1031 Right lower quadrant pain: Secondary | ICD-10-CM | POA: Insufficient documentation

## 2020-05-19 DIAGNOSIS — R109 Unspecified abdominal pain: Secondary | ICD-10-CM

## 2020-05-19 DIAGNOSIS — K529 Noninfective gastroenteritis and colitis, unspecified: Secondary | ICD-10-CM

## 2020-05-19 HISTORY — DX: Noninfective gastroenteritis and colitis, unspecified: K52.9

## 2020-05-19 MED ORDER — ACETAMINOPHEN 325 MG PO TABS
650.0000 mg | ORAL_TABLET | Freq: Once | ORAL | Status: AC
  Administered 2020-05-19: 325 mg via ORAL

## 2020-05-19 MED ORDER — ACETAMINOPHEN 325 MG PO TABS
325.0000 mg | ORAL_TABLET | Freq: Once | ORAL | Status: DC
  Filled 2020-05-19: qty 1

## 2020-05-19 NOTE — ED Triage Notes (Signed)
Pt reports that she started having lower abd pain that morning, worse on the R side, denies urinary symptoms or discharge. Pt reports fevers and nausea.

## 2020-05-20 ENCOUNTER — Emergency Department (HOSPITAL_COMMUNITY): Payer: Medicaid Other

## 2020-05-20 LAB — CBC
HCT: 39.2 % (ref 36.0–46.0)
Hemoglobin: 13.3 g/dL (ref 12.0–15.0)
MCH: 31.1 pg (ref 26.0–34.0)
MCHC: 33.9 g/dL (ref 30.0–36.0)
MCV: 91.6 fL (ref 80.0–100.0)
Platelets: 240 10*3/uL (ref 150–400)
RBC: 4.28 MIL/uL (ref 3.87–5.11)
RDW: 12 % (ref 11.5–15.5)
WBC: 9.8 10*3/uL (ref 4.0–10.5)
nRBC: 0 % (ref 0.0–0.2)

## 2020-05-20 LAB — COMPREHENSIVE METABOLIC PANEL
ALT: 12 U/L (ref 0–44)
AST: 16 U/L (ref 15–41)
Albumin: 3.5 g/dL (ref 3.5–5.0)
Alkaline Phosphatase: 68 U/L (ref 38–126)
Anion gap: 11 (ref 5–15)
BUN: 12 mg/dL (ref 6–20)
CO2: 22 mmol/L (ref 22–32)
Calcium: 9.3 mg/dL (ref 8.9–10.3)
Chloride: 104 mmol/L (ref 98–111)
Creatinine, Ser: 0.82 mg/dL (ref 0.44–1.00)
GFR calc non Af Amer: >60 mL/min (ref >60)
Glucose, Bld: 107 mg/dL — ABNORMAL HIGH (ref 70–99)
Potassium: 3.5 mmol/L (ref 3.5–5.1)
Sodium: 137 mmol/L (ref 135–145)
Total Bilirubin: 0.4 mg/dL (ref 0.3–1.2)
Total Protein: 7.6 g/dL (ref 6.5–8.1)

## 2020-05-20 LAB — URINALYSIS, ROUTINE W REFLEX MICROSCOPIC
Bilirubin Urine: NEGATIVE
Glucose, UA: NEGATIVE mg/dL
Hgb urine dipstick: NEGATIVE
Ketones, ur: NEGATIVE mg/dL
Leukocytes,Ua: NEGATIVE
Nitrite: NEGATIVE
Protein, ur: NEGATIVE mg/dL
Specific Gravity, Urine: 1.027 (ref 1.005–1.030)
pH: 6 (ref 5.0–8.0)

## 2020-05-20 LAB — LIPASE, BLOOD: Lipase: 30 U/L (ref 11–51)

## 2020-05-20 LAB — I-STAT BETA HCG BLOOD, ED (MC, WL, AP ONLY): I-stat hCG, quantitative: <5 m[IU]/mL (ref <5)

## 2020-05-20 MED ORDER — IOHEXOL 300 MG/ML  SOLN
100.0000 mL | Freq: Once | INTRAMUSCULAR | Status: AC | PRN
  Administered 2020-05-20: 100 mL via INTRAVENOUS

## 2020-05-20 MED ORDER — ONDANSETRON 4 MG PO TBDP: 4.0000 mg | ORAL_TABLET | Freq: Three times a day (TID) | ORAL | 0 refills | Status: DC | PRN

## 2020-05-20 MED ORDER — AMOXICILLIN-POT CLAVULANATE 875-125 MG PO TABS: 1.0000 | ORAL_TABLET | Freq: Two times a day (BID) | ORAL | 0 refills | Status: DC

## 2020-05-20 NOTE — ED Notes (Signed)
Pt d/c home per MD order. Discharge summary reviewed with pt,pt verbalizes understanding. Reports discharge ride home.  

## 2020-05-20 NOTE — ED Notes (Signed)
This RN unable to obtain IV access; Tresa Endo RN unable to obtain- IV team consult placed.

## 2020-05-20 NOTE — ED Notes (Signed)
Pt returned from ultrasound

## 2020-05-20 NOTE — ED Provider Notes (Signed)
[facility] EMERGENCY DEPARTMENT Provider Note   CSN: [id number] Arrival date & time: 05/19/20  2309     History Chief Complaint  Patient presents with   Abdominal Pain    Amy Case is a 34 y.o. female.  34 year old female presents to the emergency department for evaluation of abdominal pain. She states that pain began at 9 AM yesterday and has been intermittent. Symptoms wax and wane in severity, but have been gradually worsening. Pain, when it returns, has become stronger since onset. Pain recurred this evening, waking her from sleep and prompting her visit to the ED. States that it began in her lower abdomen, but has shifted more periumbilical and right-sided. No relief from ibuprofen. States that she felt feverish and took her temperature at home with a reading of 103F. Denies vomiting, diarrhea, melena, hematochezia, dysuria, hematuria, vaginal bleeding, vaginal discharge, concern for STDs. She is sexually active with one female partner. No history of abdominal surgeries.  The history is provided by the patient. No language interpreter was used.  Abdominal Pain      Past Medical History:  Diagnosis Date   Colitis     There are no problems to display for this patient.   History reviewed. No pertinent surgical history.   OB History   No obstetric history on file.     No family history on file.  Social History   Tobacco Use   Smoking status: Current Every Day Smoker   Smokeless tobacco: Never Used  Substance Use Topics   Alcohol use: Yes   Drug use: No    Home Medications Prior to Admission medications   Medication Sig Start Date End Date Taking? Authorizing Provider  acetaminophen (TYLENOL) 325 MG tablet Take 650 mg by mouth every 6 (six) hours as needed for mild pain.    [provider]  cyclobenzaprine (FLEXERIL) 10 MG tablet Take 10 mg by mouth 3 (three) times daily as needed for muscle spasms.    [provider]  ibuprofen (ADVIL,MOTRIN) 200 MG tablet Take 400-800 mg by mouth every 6 (six) hours as needed for headache or mild pain.    [provider]  metoCLOPramide (REGLAN) 10 MG tablet Take 1 tablet (10 mg total) by mouth every 8 (eight) hours as needed for nausea (headache). 12/26/16   Marijean Niemann, MD  metroNIDAZOLE (FLAGYL) 500 MG tablet Take 1 tablet (500 mg total) by mouth 2 (two) times daily. One po bid x 7 days Patient not taking: Reported on 12/26/2016 02/05/16   Pricilla Loveless, MD  naproxen (NAPROSYN) 500 MG tablet Take 1 tablet (500 mg total) by mouth 2 (two) times daily with a meal. Patient not taking: Reported on 12/26/2016 09/19/16   Deatra Canter, FNP  nitrofurantoin, macrocrystal-monohydrate, (MACROBID) 100 MG capsule Take 1 capsule (100 mg total) by mouth 2 (two) times daily. Patient not taking: Reported on 12/26/2016 02/05/16   Pricilla Loveless, MD    Allergies    Patient has no known allergies.  Review of Systems   Review of Systems  Gastrointestinal: Positive for abdominal pain.  Ten systems reviewed and are negative for acute change, except as noted in the HPI.    Physical Exam Updated Vital Signs BP 102/68 (BP Location: Right Arm)    Pulse 90    Temp 99.4 F (37.4 C) (Oral)    Resp 17    SpO2 98%   Physical Exam Vitals and nursing note reviewed.  Constitutional:  General: She is not in acute distress.    Appearance: She is well-developed. She is not diaphoretic.     Comments: Nontoxic appearing female.  HENT:     Head: Normocephalic and atraumatic.  Eyes:     General: No scleral icterus.    Conjunctiva/sclera: Conjunctivae normal.  Cardiovascular:     Rate and Rhythm: Normal rate and regular rhythm.     Pulses: Normal pulses.  Pulmonary:     Effort: Pulmonary effort is normal. No respiratory distress.     Comments: Respirations even and unlabored Abdominal:     Palpations: Abdomen is soft.     Comments: Soft abdomen. Difficult exam do to  cooperation and hesitancy with palpation. Reports some TTP in the epigastrium, periumbilical region. Negative Murphy's sign. No peritoneal signs, palpable masses.  Genitourinary:    Comments: Declines pelvic exam. Musculoskeletal:        General: Normal range of motion.     Cervical back: Normal range of motion.  Skin:    General: Skin is warm and dry.     Coloration: Skin is not pale.     Findings: No erythema or rash.  Neurological:     Mental Status: She is alert and oriented to person, place, and time.     Coordination: Coordination normal.  Psychiatric:        Behavior: Behavior normal.     ED Results / Procedures / Treatments   Labs (all labs ordered are listed, but only abnormal results are displayed) Labs Reviewed  COMPREHENSIVE METABOLIC PANEL - Abnormal; Notable for the following components:      Result Value   Glucose, Bld 107 (*)    All other components within normal limits  URINALYSIS, ROUTINE W REFLEX MICROSCOPIC - Abnormal; Notable for the following components:   APPearance HAZY (*)    All other components within normal limits  LIPASE, BLOOD  CBC  I-STAT BETA HCG BLOOD, ED ([facility], [facility], [facility] ONLY)    EKG None  Radiology No results found.  Procedures Procedures (including critical care time)  Medications Ordered in ED Medications  acetaminophen (TYLENOL) tablet 650 mg (325 mg Oral Given 05/19/20 2339)    ED Course  I have reviewed the triage vital signs and the nursing notes.  Pertinent labs & imaging results that were available during my care of the patient were reviewed by me and considered in my medical decision making (see chart for details).    MDM Rules/Calculators/A&P                          34 year old female presents to the emergency department for complaints of abdominal pain which began at 9 AM yesterday.  Symptoms have been intermittent and gradually worsening.  On my assessment, patient reports being pain-free.  No nausea, vomiting,  diarrhea, urinary symptoms.  Laboratory evaluation initiated in triage has been reassuring.  Reports fever prior to arrival, but has been afebrile in the ED.  Does not meet criteria for SIRS/Sepsis.  Pending US abdomen and pelvis for further evaluation of pain.  Dispo pending imaging.  Care signed out to [facility], PA-C at change of shift.   Final Clinical Impression(s) / ED Diagnoses Final diagnoses:  Abdominal pain    Rx / DC Orders ED Discharge Orders    None       Antony Madura, PA-C 05/20/20 [id number]    Maia Plan, MD 05/20/20 1950

## 2020-05-20 NOTE — ED Notes (Signed)
Pt went to ultrasound.

## 2020-05-20 NOTE — ED Provider Notes (Signed)
Care transferred from previous provider, [place], [place]. See note for full HPI.  Intermittent abd pain, difficult exam, declined Pelvic exam. No d/c.  Dispo pending imaging, if neg likely dc home. Physical Exam  BP 108/70   Pulse 93   Temp 99.4 F (37.4 C) (Oral)   Resp 17   SpO2 96%   Physical Exam Vitals and nursing note reviewed.  Constitutional:      General: She is not in acute distress.    Appearance: She is well-developed. She is not ill-appearing, toxic-appearing or diaphoretic.  HENT:     Head: Normocephalic and atraumatic.     Mouth/Throat:     Mouth: Mucous membranes are moist.  Eyes:     Pupils: Pupils are equal, round, and reactive to light.  Cardiovascular:     Rate and Rhythm: Normal rate.     Heart sounds: Normal heart sounds.  Pulmonary:     Effort: Pulmonary effort is normal. No respiratory distress.     Breath sounds: Normal breath sounds.  Abdominal:     General: Bowel sounds are normal. There is no distension.     Palpations: Abdomen is soft.     Tenderness: There is no abdominal tenderness. There is no right CVA tenderness, left CVA tenderness or guarding. Negative signs include Murphy's sign and McBurney's sign.     Hernia: No hernia is present.     Comments: Soft, nontender without rebound or guarding.  No overlying skin changes to abdominal wall.  Genitourinary:    Comments: Declined GU exam Musculoskeletal:        General: Normal range of motion.     Cervical back: Normal range of motion.  Skin:    General: Skin is warm and dry.     Capillary Refill: Capillary refill takes less than 2 seconds.  Neurological:     Mental Status: She is alert.    ED Course/Procedures     Procedures Labs Reviewed  COMPREHENSIVE METABOLIC PANEL - Abnormal; Notable for the following components:      Result Value   Glucose, Bld 107 (*)    All other components within normal limits  URINALYSIS, ROUTINE W REFLEX MICROSCOPIC - Abnormal; Notable for the following  components:   APPearance HAZY (*)    All other components within normal limits  LIPASE, BLOOD  CBC  I-STAT BETA HCG BLOOD, ED ([facility], [facility], [facility] ONLY)  US Abdomen Complete  Result Date: 05/20/2020 CLINICAL DATA:  General abdominal pain. EXAM: ABDOMEN ULTRASOUND COMPLETE COMPARISON:  No prior. FINDINGS: Gallbladder: No gallstones or wall thickening visualized. No sonographic Murphy sign noted by sonographer. Common bile duct: Diameter: 2.8 mm Liver: 82.2 x 2.0 x 2.3 solid hyperechoic mass is noted the liver adjacent to the gallbladder fossa. Although this could represent a benign hemangioma, further evaluation with MRI of the liver suggested. Within normal limits in parenchymal echogenicity. Portal vein is patent on color Doppler imaging with normal direction of blood flow towards the liver. IVC: No abnormality visualized. Pancreas: Visualized portion unremarkable. Spleen: Size and appearance within normal limits. Right Kidney: Length: 10.4 cm. Echogenicity within normal limits. No mass or hydronephrosis visualized. Left Kidney: Length: 10.7 cm. Echogenicity within normal limits. No mass or hydronephrosis visualized. Abdominal aorta: No aneurysm visualized. Other findings: None. IMPRESSION: 1. No gallstones or biliary distention. 2. 2.3 x 2.0 x 2.3 cm solid hyperechoic mass is noted the liver adjacent to the gallbladder fossa. Although this could represent a benign hemangioma, further evaluation with MRI of the  liver suggested. Electronically Signed   By: Maisie Fus  Register   On: 05/20/2020 06:56   CT Abdomen Pelvis W Contrast  Result Date: 05/20/2020 CLINICAL DATA:  Lower abdominal pain, worse on the right. Fevers and nausea. EXAM: CT ABDOMEN AND PELVIS WITH CONTRAST TECHNIQUE: Multidetector CT imaging of the abdomen and pelvis was performed using the standard protocol following bolus administration of intravenous contrast. CONTRAST:  OMNIPAQUE IOHEXOL 300 MG/ML  SOLN COMPARISON:  Ultrasound exam from  earlier same day. FINDINGS: Lower chest: Unremarkable. Hepatobiliary: As noted on recent ultrasound exam, there is a 2.0 x 1.8 cm hypoenhancing lesion in the inferior right liver adjacent to the gallbladder fossa (27/3). No other focal liver lesion evident. There is no evidence for gallstones, gallbladder wall thickening, or pericholecystic fluid. No intrahepatic or extrahepatic biliary dilation. Pancreas: No focal mass lesion. No dilatation of the main duct. No intraparenchymal cyst. No peripancreatic edema. Spleen: No splenomegaly. No focal mass lesion. Adrenals/Urinary Tract: No adrenal nodule or mass. Kidneys unremarkable. No evidence for hydroureter. The urinary bladder appears normal for the degree of distention. Stomach/Bowel: Stomach is unremarkable. No gastric wall thickening. No evidence of outlet obstruction. Duodenum is normally positioned as is the ligament of Treitz. No small bowel wall thickening. No small bowel dilatation. The terminal ileum is normal. The appendix is best seen on coronal images and is unremarkable. Right colon is completely decompressed which hinders assessment, but the does appear to be associated mural edema and subtle pericolonic edema/stranding. Left colon unremarkable with normal stool volume. Vascular/Lymphatic: No abdominal aortic aneurysm. No abdominal aortic atherosclerotic calcification. There is no gastrohepatic or hepatoduodenal ligament lymphadenopathy. No retroperitoneal or mesenteric lymphadenopathy. No pelvic sidewall lymphadenopathy. Upper normal lymph nodes are seen in the external iliac chains bilaterally. Reproductive: The uterus is unremarkable.  There is no adnexal mass. Other: No intraperitoneal free fluid. Musculoskeletal: No worrisome lytic or sclerotic osseous abnormality. IMPRESSION: 1. The right colon is completely decompressed which hinders assessment, but there does appear to be associated mural edema/thickening with subtle pericolonic edema/stranding.  Imaging features suggest infectious/inflammatory right colitis. 2. 2.0 x 1.8 cm hypoenhancing lesion in the inferior right liver adjacent to the gallbladder fossa. This cannot be definitively characterized. While likely benign in a young patient with no cancer history, MRI of the abdomen without and with contrast recommended to further evaluate. Electronically Signed   By: Kennith Center M.D.   On: 05/20/2020 10:07   US PELVIC COMPLETE WITH TRANSVAGINAL  Result Date: 05/20/2020 CLINICAL DATA:  General abdominal pain. EXAM: TRANSABDOMINAL AND TRANSVAGINAL ULTRASOUND OF PELVIS TECHNIQUE: Both transabdominal and transvaginal ultrasound examinations of the pelvis were performed. Transabdominal technique was performed for global imaging of the pelvis including uterus, ovaries, adnexal regions, and pelvic cul-de-sac. It was necessary to proceed with endovaginal exam following the transabdominal exam to visualize the uterus and ovaries. COMPARISON:  No prior. FINDINGS: Uterus Measurements: 8.7 x 4.3 x 4.4 cm = volume: 83.9 mL. No fibroids or other mass visualized. Endometrium Thickness: 12.1.  No focal abnormality visualized. Right ovary Measurements: 1.9 x 2.9 x 2.1 cm = volume: 6.0 mL. Tiny 1.2 cm simple cyst right ovary consistent with physiologic cyst. Left ovary Measurements: 3.1 x 1.8 x 2.2 cm = volume: 6.2 mL. Normal appearance/no adnexal mass. Other findings: Trace free pelvic fluid cannot be excluded. IMPRESSION: 1. Tiny 1.2 cm simple cyst right ovary consistent with physiologic cyst. 2. Trace free pelvic fluid cannot be excluded. Exam is otherwise unremarkable. Electronically Signed   By: Maisie Fus  Register   On: 05/20/2020 07:05   MDM  Dispo pending imaging.Possible COVID test outpatient? If imaging negative  Discussed results of [place]. Possible hemangioma. Patient requesting CT AP because "google told me this could be my appendix." Discussed with patient this is always a possibility however appendicitis does  not usually presents as intermittent pain. Given low grade temp here will obtained ct ap to R/O appy. Continues to declined pelvic exam consistent with previous providers exam.  Patient reassessed.  She has not had any pain while she has been under my care.  She is tolerating p.o. intake without difficulty.  Discussed CT findings of possible colitis.  Given she had a low-grade temp of 100.3 on arrival will treat with antibiotics.  Discussed CT findings as well with possible mass cannot rule out malignancy.  I have discussed with attending, Dr. Rosalia Hammers.  Recommends outpatient follow-up with GI and MRI outpatient.  She is nontender over this area and appears otherwise well.  I have low suspicion for abscess or infectious process causing this mass.    Patient is nontoxic, nonseptic appearing, in no apparent distress.  Patient's pain and other symptoms adequately managed in emergency department.  Fluid bolus given.  Labs, imaging and vitals reviewed. On repeat exam patient does not have a surgical abdomin and there are no peritoneal signs.  No indication of appendicitis, bowel obstruction, bowel perforation, cholecystitis, PID or ectopic pregnancy.    The patient has been appropriately medically screened and/or stabilized in the ED. I have low suspicion for any other emergent medical condition which would require further screening, evaluation or treatment in the ED or require inpatient management.  Patient is hemodynamically stable and in no acute distress.  Patient able to ambulate in department prior to ED.  Evaluation does not show acute pathology that would require ongoing or additional emergent interventions while in the emergency department or further inpatient treatment.  I have discussed the diagnosis with the patient and answered all questions.  Pain is been managed while in the emergency department and patient has no further complaints prior to discharge.  Patient is comfortable with plan discussed in room  and is stable for discharge at this time.  I have discussed strict return precautions for returning to the emergency department.  Patient was encouraged to follow-up with PCP/specialist refer to at discharge.         Ahmani Prehn A, PA-C 05/20/20 1031    Margarita Grizzle, MD 05/21/20 [phone]

## 2020-05-20 NOTE — Discharge Instructions (Signed)
Your CT scan showed possible inflammatory changes to your right lower quadrant however your appendix did not appear infected.  Given your earlier fever we are treating you for colitis with antibiotics.  I have also written you prescription for Zofran in case you become nauseous.  Make sure to increase your fluids.  May take Tylenol or ibuprofen as needed for pain  Your CT scan did show a possible haziness just below your liver.  They are recommending follow-up MRI to determine what this area is.  Please call GI to schedule an appointment.  Their contact numbers listed on your paperwork.  It is very important you follow-up with them for MRI to ensure this is not a malignancy.

## 2020-05-21 ENCOUNTER — Encounter: Payer: Self-pay | Admitting: Physician Assistant

## 2020-06-12 ENCOUNTER — Other Ambulatory Visit: Payer: Self-pay

## 2020-06-12 ENCOUNTER — Emergency Department (INDEPENDENT_AMBULATORY_CARE_PROVIDER_SITE_OTHER)
Admission: EM | Admit: 2020-06-12 | Discharge: 2020-06-12 | Disposition: A | Discharge status: Home / Self Care | Payer: Medicaid Other | Source: Home / Self Care

## 2020-06-12 DIAGNOSIS — S025XXB Fracture of tooth (traumatic), initial encounter for open fracture: Secondary | ICD-10-CM | POA: Diagnosis not present

## 2020-06-12 MED ORDER — CHLORHEXIDINE GLUCONATE 0.12 % MT SOLN: 15.0000 mL | Freq: Two times a day (BID) | OROMUCOSAL | 0 refills | Status: DC

## 2020-06-12 NOTE — Discharge Instructions (Signed)
  Please use the mouthwash to help prevent dental infection. Call Monday to schedule a follow up appointment with a dentist for further evaluation and treatment of broken tooth.

## 2020-06-12 NOTE — ED Triage Notes (Signed)
Pt states that he tooth broke off and she wants to make sure there is no infection. Pt states that she doesn't feel any pain.

## 2020-06-12 NOTE — ED Provider Notes (Signed)
[facility]    CSN: [id number] Arrival date & time: 06/12/20  1118      History   Chief Complaint Chief Complaint  Patient presents with  . dental problem    HPI Amy Case is a 34 y.o. female.   HPI  Amy Case is a 34 y.o. female presenting to UC with c/o broken tooth that initially had a root canal on February 2021.  A small piece of the tooth broke off earlier in the year but this morning the rest of the tooth came off while brushing her teeth.  Denies pain at this time but she is concerned for infection. She has not seen a dentist since initial root canal.    Past Medical History:  Diagnosis Date  . Colitis     There are no problems to display for this patient.   History reviewed. No pertinent surgical history.  OB History   No obstetric history on file.      Home Medications    Prior to Admission medications   Medication Sig Start Date End Date Taking? Authorizing Provider  amoxicillin-clavulanate (AUGMENTIN) 875-125 MG tablet Take 1 tablet by mouth every 12 (twelve) hours. 05/20/20   Henderly, Britni A, PA-C  chlorhexidine (PERIDEX) 0.12 % solution Use as directed 15 mLs in the mouth or throat 2 (two) times daily. 06/12/20   Lurene Shadow, PA-C  ondansetron (ZOFRAN ODT) 4 MG disintegrating tablet Take 1 tablet (4 mg total) by mouth every 8 (eight) hours as needed for nausea or vomiting. 05/20/20   Henderly, Britni A, PA-C  metoCLOPramide (REGLAN) 10 MG tablet Take 1 tablet (10 mg total) by mouth every 8 (eight) hours as needed for nausea (headache). Patient not taking: Reported on 05/20/2020 12/26/16 05/20/20  Marijean Niemann, MD    Family History No family history on file.  Social History Social History   Tobacco Use  . Smoking status: Current Every Day Smoker  . Smokeless tobacco: Never Used  Substance Use Topics  . Alcohol use: Yes  . Drug use: No     Allergies   Patient has no known allergies.   Review of  Systems Review of Systems  Constitutional: Negative for chills and fever.  HENT: Positive for dental problem. Negative for facial swelling and sore throat.      Physical Exam Triage Vital Signs ED Triage Vitals  Enc Vitals Group     BP 06/12/20 1240 118/80     Pulse Rate 06/12/20 1240 87     Resp --      Temp 06/12/20 1240 98.5 F (36.9 C)     Temp Source 06/12/20 1240 Oral     SpO2 06/12/20 1240 97 %     Weight 06/12/20 1238 190 lb (86.2 kg)     Height 06/12/20 1238 5\' 10"  (1.778 m)     Head Circumference --      Peak Flow --      Pain Score 06/12/20 1238 0     Pain Loc --      Pain Edu? --      Excl. in GC? --    No data found.  Updated Vital Signs BP 118/80 (BP Location: Left Arm)   Pulse 87   Temp 98.5 F (36.9 C) (Oral)   Ht 5\' 10"  (1.778 m)   Wt 190 lb (86.2 kg)   LMP 06/03/2020   SpO2 97%   BMI 27.26 kg/m   Visual Acuity Right Eye Distance:  Left Eye Distance:   Bilateral Distance:    Right Eye Near:   Left Eye Near:    Bilateral Near:     Physical Exam Vitals and nursing note reviewed.  Constitutional:      Appearance: Normal appearance. She is well-developed.  HENT:     Head: Normocephalic and atraumatic.     Mouth/Throat:   Cardiovascular:     Rate and Rhythm: Normal rate.  Pulmonary:     Effort: Pulmonary effort is normal.  Musculoskeletal:        General: Normal range of motion.     Cervical back: Normal range of motion.  Skin:    General: Skin is warm and dry.  Neurological:     Mental Status: She is alert and oriented to person, place, and time.  Psychiatric:        Behavior: Behavior normal.      UC Treatments / Results  Labs (all labs ordered are listed, but only abnormal results are displayed) Labs Reviewed - No data to display  EKG   Radiology No results found.  Procedures Procedures (including critical care time)  Medications Ordered in UC Medications - No data to display  Initial Impression / Assessment  and Plan / UC Course  I have reviewed the triage vital signs and the nursing notes.  Pertinent labs & imaging results that were available during my care of the patient were reviewed by me and considered in my medical decision making (see chart for details).    Broken tooth without evidence of infection at this time Due to exposed root, will start on chlorhexidine mouthwash Encouraged close f/u with a dentist next week Resource guide provided AVS given  Final Clinical Impressions(s) / UC Diagnoses   Final diagnoses:  Open fracture of tooth, initial encounter     Discharge Instructions      Please use the mouthwash to help prevent dental infection. Call Monday to schedule a follow up appointment with a dentist for further evaluation and treatment of broken tooth.     ED Prescriptions    Medication Sig Dispense Auth. Provider   chlorhexidine (PERIDEX) 0.12 % solution Use as directed 15 mLs in the mouth or throat 2 (two) times daily. 120 mL Lurene Shadow, PA-C     PDMP not reviewed this encounter.   Lurene Shadow, PA-C 06/12/20 1308

## 2020-06-16 ENCOUNTER — Encounter: Payer: Self-pay | Admitting: Physician Assistant

## 2020-06-16 ENCOUNTER — Ambulatory Visit (INDEPENDENT_AMBULATORY_CARE_PROVIDER_SITE_OTHER): Payer: Medicaid Other | Admitting: Physician Assistant

## 2020-06-16 VITALS — BP 108/70 | HR 81 | Ht 70.0 in | Wt 184.0 lb

## 2020-06-16 DIAGNOSIS — Z8719 Personal history of other diseases of the digestive system: Secondary | ICD-10-CM | POA: Diagnosis not present

## 2020-06-16 DIAGNOSIS — R932 Abnormal findings on diagnostic imaging of liver and biliary tract: Secondary | ICD-10-CM | POA: Diagnosis not present

## 2020-06-16 NOTE — Patient Instructions (Signed)
If you are age 36 or older, your body mass index should be between 23-30. Your Body mass index is 26.4 kg/m. If this is out of the aforementioned range listed, please consider follow up with your Primary Care Provider.  If you are age 27 or younger, your body mass index should be between 19-25. Your Body mass index is 26.4 kg/m. If this is out of the aformentioned range listed, please consider follow up with your Primary Care Provider.   You have been scheduled for an MRI at [facility] (1st floor radiology) on Wednesday 06/23/20. Your appointment time is 7 am. Please arrive 15 minutes prior to your appointment time for registration purposes. Please make certain not to have anything to eat or drink 6 hours prior to your test. In addition, if you have any metal in your body, have a pacemaker or defibrillator, please be sure to let your ordering physician know. This test typically takes 45 minutes to 1 hour to complete. Should you need to reschedule, please call [phone] to do so.

## 2020-06-16 NOTE — Progress Notes (Signed)
Chief Complaint: Follow-up ER visit for colitis  HPI:    Amy Case is a 34 year old African-American female with past medical history as listed below including previous colitis 10 years ago, who presents to clinic today for follow-up after an ER visit for colitis recently.    05/19/2020 patient seen in the ER at [facility] for colitis describing intermittent abdominal pain.  Labs at that time showed a CBC with a minimally elevated glucose at 107, lipase and CMP normal.  Ultrasound of the abdomen with no gallstones or biliary distention, 2.3 x 2.0 x 2.3 cm solid hyperechoic mass noted in the liver adjacent to the gallbladder fossa, it was thought this could represent a benign hemangioma but recommended further evaluation with MRI of the liver.  CT of the abdomen pelvis with contrast that day showed right colon was completely compressed which hindered assessment but there did appear to be associated mural edema/thickening with subtle pericolonic edema/stranding, imaging features suggestive of infectious/inflammatory right colitis.  There is noted 2.0 x 1.8 cm hypoenhancing lesion in the inferior right liver adjacent to the gallbladder, this was favored to be a hemangioma but recommended MRI of the abdomen with and without contrast for further evaluation.  Patient was discharged.    05/21/2020 patient seen in [place] emergency department 10 describe that she was diagnosed with colitis 3 days prior and continued with unrelenting abdominal pain.  Repeat CT was done which showed wall thickening of the ascending and transverse colon compatible with acute colitis.  She was given Augmentin for 10 days.    Today, the patient tells me that typically she has very normal bowel movements, she is one other episode of colitis which was presumed to be infectious maybe 10 years ago.  Explains that after taking the antibiotics all of her pain subsided and she has had really no problems since then.  She is only following in our  clinic now because of liver lesion.  Tells me she has no history of any liver problems problems.    Does briefly discuss that occasionally she will wake up nauseous, but this usually subsides after eating something.    Denies fever, chills, weight loss, change in bowel habits, continued abdominal flux.  Past Medical History:  Diagnosis Date  . Colitis     History reviewed. No pertinent surgical history.  Current Outpatient Medications  Medication Sig Dispense Refill  . chlorhexidine (PERIDEX) 0.12 % solution Use as directed 15 mLs in the mouth or throat 2 (two) times daily. 120 mL 0   No current facility-administered medications for this visit.    Allergies as of 06/16/2020  . (No Known Allergies)    Family History  Problem Relation Age of Onset  . Colon cancer Neg Hx   . Pancreatic cancer Neg Hx   . Esophageal cancer Neg Hx     Social History   Socioeconomic History  . Marital status: Single    Spouse name: Not on file  . Number of children: Not on file  . Years of education: Not on file  . Highest education level: Not on file  Occupational History  . Not on file  Tobacco Use  . Smoking status: Current Every Day Smoker  . Smokeless tobacco: Never Used  Substance and Sexual Activity  . Alcohol use: Yes    Comment: a drink or two a month  . Drug use: No  . Sexual activity: Not on file  Other Topics Concern  . Not on file  Social History Narrative  . Not on file   Social Determinants of Health   Financial Resource Strain:   . Difficulty of Paying Living Expenses: Not on file  Food Insecurity:   . Worried About Programme researcher, broadcasting/film/video in the Last Year: Not on file  . Ran Out of Food in the Last Year: Not on file  Transportation Needs:   . Lack of Transportation (Medical): Not on file  . Lack of Transportation (Non-Medical): Not on file  Physical Activity:   . Days of Exercise per Week: Not on file  . Minutes of Exercise per Session: Not on file  Stress:   .  Feeling of Stress : Not on file  Social Connections:   . Frequency of Communication with Friends and Family: Not on file  . Frequency of Social Gatherings with Friends and Family: Not on file  . Attends Religious Services: Not on file  . Active Member of Clubs or Organizations: Not on file  . Attends Banker Meetings: Not on file  . Marital Status: Not on file  Intimate Partner Violence:   . Fear of Current or Ex-Partner: Not on file  . Emotionally Abused: Not on file  . Physically Abused: Not on file  . Sexually Abused: Not on file    Review of Systems:    Constitutional: No weight loss, fever or chills Skin: No rash  Cardiovascular: No chest pain Respiratory: No SOB Gastrointestinal: See HPI and otherwise negative Genitourinary: No dysuria  Neurological: No headache, dizziness or syncope Musculoskeletal: No new muscle or joint pain Hematologic: No bleeding Psychiatric: No history of depression or anxiety   Physical Exam:  Vital signs: BP 108/70   Pulse 81   Ht 5\' 10"  (1.778 m)   Wt 184 lb (83.5 kg)   LMP 06/03/2020   SpO2 97%   BMI 26.40 kg/m   Constitutional:   Pleasant AA female appears to be in NAD, Well developed, Well nourished, alert and cooperative Head:  Normocephalic and atraumatic. Eyes:   PEERL, EOMI. No icterus. Conjunctiva pink. Ears:  Normal auditory acuity. Neck:  Supple Throat: Oral cavity and pharynx without inflammation, swelling or lesion.  Respiratory: Respirations even and unlabored. Lungs clear to auscultation bilaterally.   No wheezes, crackles, or rhonchi.  Cardiovascular: Normal S1, S2. No MRG. Regular rate and rhythm. No peripheral edema, cyanosis or pallor.  Gastrointestinal:  Soft, nondistended, nontender. No rebound or guarding. Normal bowel sounds. No appreciable masses or hepatomegaly. Rectal:  Not performed.  Msk:  Symmetrical without gross deformities. Without edema, no deformity or joint abnormality.  Neurologic:  Alert  and  oriented x4;  grossly normal neurologically.  Skin:   Dry and intact without significant lesions or rashes. Psychiatric: Demonstrates good judgement and reason without abnormal affect or behaviors.  RELEVANT LABS AND IMAGING: CBC    Component Value Date/Time   WBC 9.8 05/19/2020 2337   RBC 4.28 05/19/2020 2337   HGB 13.3 05/19/2020 2337   HCT 39.2 05/19/2020 2337   PLT 240 05/19/2020 2337   MCV 91.6 05/19/2020 2337   MCH 31.1 05/19/2020 2337   MCHC 33.9 05/19/2020 2337   RDW 12.0 05/19/2020 2337   LYMPHSABS 2.7 02/09/2015 1925   MONOABS 0.4 02/09/2015 1925   EOSABS 0.2 02/09/2015 1925   BASOSABS 0.0 02/09/2015 1925    CMP     Component Value Date/Time   NA 137 05/19/2020 2337   K 3.5 05/19/2020 2337   CL 104 05/19/2020 2337  CO2 22 05/19/2020 2337   GLUCOSE 107 (H) 05/19/2020 2337   BUN 12 05/19/2020 2337   CREATININE 0.82 05/19/2020 2337   CALCIUM 9.3 05/19/2020 2337   PROT 7.6 05/19/2020 2337   ALBUMIN 3.5 05/19/2020 2337   AST 16 05/19/2020 2337   ALT 12 05/19/2020 2337   ALKPHOS 68 05/19/2020 2337   BILITOT 0.4 05/19/2020 2337   GFRNONAA >60 05/19/2020 2337   GFRAA >60 05/05/2015 2200    Assessment: 1.  Liver lesion: Seen at time of recent ultrasound and CT; most likely meningioma 2.  History of colitis: Most likely these were infectious as patient typically has very normal and regular bowel movements and these episodes resolved with antibiotics  Plan: 1.  Scheduled patient for an MRI with and without contrast given abnormal CT of the liver recently. 2.  Briefly discussed colitis, typically patient has no GI issues at all, certainly these issues are not chronic. 3.  Patient to follow in clinic per recommendations after MRI.  She was assigned to Dr. Myrtie Neither this afternoon.  Hyacinth Meeker, PA-C [facility] 06/16/2020, 3:26 PM  Cc: No ref. provider found

## 2020-06-18 ENCOUNTER — Telehealth: Payer: Self-pay

## 2020-06-18 NOTE — Telephone Encounter (Signed)
The pt has been advised and will call back on Monday when she has her work schedule to make the appt.

## 2020-06-18 NOTE — Telephone Encounter (Signed)
-----   Message from Unk Lightning, [place] sent at 06/18/2020  1:11 PM EDT ----- Regarding: needs colon Please call patient and let her know that Dr Myrtie Neither reviewed her records and would like her to have a colonoscopy due to this colitis.  Will need to be scheduled [facility] 4-6 weeks out from time she finished abx. Thanks-JLL ----- Message ----- From: Sherrilyn Rist, MD Sent: 06/18/2020  12:50 PM EDT To: Unk Lightning, PA    ----- Message ----- From: Unk Lightning, [place] Sent: 06/16/2020   3:54 PM EDT To: Sherrilyn Rist, MD

## 2020-06-18 NOTE — Progress Notes (Signed)
____________________________________________________________  Attending physician addendum:  Thank you for sending this case to me. I have reviewed the entire note.  Agree with MRI to further characterize the liver lesion.  Because of the recent pain episode and CT findings unclear (would be unusual location for diverticulitis).  This patient needs to be scheduled for a colonoscopy.  Amada Jupiter, MD  ____________________________________________________________

## 2020-06-23 ENCOUNTER — Ambulatory Visit (HOSPITAL_COMMUNITY)
Admission: RE | Admit: 2020-06-23 | Discharge: 2020-06-23 | Disposition: A | Discharge status: Home / Self Care | Payer: Medicaid Other | Source: Ambulatory Visit | Attending: Physician Assistant | Admitting: Physician Assistant

## 2020-06-23 ENCOUNTER — Other Ambulatory Visit: Payer: Self-pay

## 2020-06-23 DIAGNOSIS — R932 Abnormal findings on diagnostic imaging of liver and biliary tract: Secondary | ICD-10-CM | POA: Insufficient documentation

## 2020-06-23 MED ORDER — GADOBUTROL 1 MMOL/ML IV SOLN
8.0000 mL | Freq: Once | INTRAVENOUS | Status: AC | PRN
  Administered 2020-06-23: 8 mL via INTRAVENOUS

## 2020-11-18 ENCOUNTER — Encounter: Payer: Self-pay | Admitting: Sports Medicine

## 2020-11-18 ENCOUNTER — Ambulatory Visit (INDEPENDENT_AMBULATORY_CARE_PROVIDER_SITE_OTHER): Payer: Medicaid Other | Admitting: Sports Medicine

## 2020-11-18 ENCOUNTER — Other Ambulatory Visit: Payer: Self-pay

## 2020-11-18 DIAGNOSIS — M79674 Pain in right toe(s): Secondary | ICD-10-CM

## 2020-11-18 DIAGNOSIS — L603 Nail dystrophy: Secondary | ICD-10-CM

## 2020-11-18 NOTE — Progress Notes (Signed)
Subjective: Amy Case is a 35 y.o. female patient seen today in office with complaint of mildly painful thickened and discolored right great toenail.  Patient reports that her toenail has been like this since 2017 and has gotten worse a piece has even fallen off and she wants to have treatment options discussed.  Patient has no other pedal complaints at this time.   Review of Systems  All other systems reviewed and are negative.    There are no problems to display for this patient.   Current Outpatient Medications on File Prior to Visit  Medication Sig Dispense Refill  . chlorhexidine (PERIDEX) 0.12 % solution Use as directed 15 mLs in the mouth or throat 2 (two) times daily. (Patient not taking: Reported on 11/18/2020) 120 mL 0  . HYDROcodone-acetaminophen (NORCO/VICODIN) 5-325 MG tablet Take 1 tablet by mouth every 4 (four) hours as needed. (Patient not taking: Reported on 11/18/2020)    . trimethoprim-polymyxin b (POLYTRIM) ophthalmic solution SMARTSIG:1 Drop(s) Left Eye Every 4 Hours (Patient not taking: Reported on 11/18/2020)    . [DISCONTINUED] metoCLOPramide (REGLAN) 10 MG tablet Take 1 tablet (10 mg total) by mouth every 8 (eight) hours as needed for nausea (headache). (Patient not taking: Reported on 05/20/2020) 10 tablet 0   No current facility-administered medications on file prior to visit.    No Known Allergies  Objective: Physical Exam  General: Well developed, nourished, no acute distress, awake, alert and oriented x 3  Vascular: Dorsalis pedis artery 2/4 bilateral, Posterior tibial artery 1/4 bilateral, skin temperature warm to warm proximal to distal bilateral lower extremities, no varicosities, pedal hair present bilateral.  Neurological: Gross sensation present via light touch bilateral.   Dermatological: Skin is warm, dry, and supple bilateral, right hallux nail severely thickened with trauma lines and discoloration, no callus/corns/hyperkeratotic tissue present  bilateral. No signs of infection bilateral.  Musculoskeletal: No boney deformities noted bilateral. Muscular strength within normal limits without painon range of motion. No pain with calf compression bilateral.  Assessment and Plan:  Problem List Items Addressed This Visit   None   Visit Diagnoses    Onychodystrophy    -  Primary   Relevant Orders   Culture, fungus without smear   Toe pain, right          -Examined patient -Discussed treatment options for painful dystrophic right 1st toenail   -Fungal culture was obtained from patient self sample of a portion of the hard nail itself from right 1st toe and sent to [facility] lab. Patient tolerated the biopsy procedure well without discomfort or need for anesthesia.  -Patient to return in 4 weeks for follow up evaluation and discussion of fungal culture results or sooner if symptoms worsen.  Asencion Islam, DPM

## 2020-12-16 ENCOUNTER — Ambulatory Visit: Payer: Medicaid Other | Admitting: Sports Medicine

## 2020-12-22 ENCOUNTER — Inpatient Hospital Stay (HOSPITAL_COMMUNITY)
Admission: EM | Admit: 2020-12-22 | Discharge: 2020-12-23 | Disposition: A | Discharge status: Home / Self Care | Payer: Medicaid Other | Attending: Obstetrics & Gynecology | Admitting: Obstetrics & Gynecology

## 2020-12-22 ENCOUNTER — Encounter (HOSPITAL_COMMUNITY): Payer: Self-pay

## 2020-12-22 DIAGNOSIS — R531 Weakness: Secondary | ICD-10-CM | POA: Diagnosis not present

## 2020-12-22 DIAGNOSIS — R109 Unspecified abdominal pain: Secondary | ICD-10-CM | POA: Diagnosis not present

## 2020-12-22 DIAGNOSIS — O99112 Other diseases of the blood and blood-forming organs and certain disorders involving the immune mechanism complicating pregnancy, second trimester: Secondary | ICD-10-CM | POA: Diagnosis not present

## 2020-12-22 DIAGNOSIS — F17200 Nicotine dependence, unspecified, uncomplicated: Secondary | ICD-10-CM | POA: Diagnosis not present

## 2020-12-22 DIAGNOSIS — D696 Thrombocytopenia, unspecified: Secondary | ICD-10-CM

## 2020-12-22 DIAGNOSIS — Z3A01 Less than 8 weeks gestation of pregnancy: Secondary | ICD-10-CM | POA: Insufficient documentation

## 2020-12-22 DIAGNOSIS — O210 Mild hyperemesis gravidarum: Secondary | ICD-10-CM | POA: Diagnosis not present

## 2020-12-22 DIAGNOSIS — Z79899 Other long term (current) drug therapy: Secondary | ICD-10-CM | POA: Insufficient documentation

## 2020-12-22 DIAGNOSIS — O26891 Other specified pregnancy related conditions, first trimester: Secondary | ICD-10-CM | POA: Insufficient documentation

## 2020-12-22 DIAGNOSIS — O99331 Smoking (tobacco) complicating pregnancy, first trimester: Secondary | ICD-10-CM | POA: Diagnosis not present

## 2020-12-22 DIAGNOSIS — O99111 Other diseases of the blood and blood-forming organs and certain disorders involving the immune mechanism complicating pregnancy, first trimester: Secondary | ICD-10-CM | POA: Diagnosis not present

## 2020-12-22 DIAGNOSIS — O219 Vomiting of pregnancy, unspecified: Secondary | ICD-10-CM | POA: Diagnosis not present

## 2020-12-22 DIAGNOSIS — R112 Nausea with vomiting, unspecified: Secondary | ICD-10-CM

## 2020-12-22 DIAGNOSIS — O26899 Other specified pregnancy related conditions, unspecified trimester: Secondary | ICD-10-CM

## 2020-12-22 LAB — CBC WITH DIFFERENTIAL/PLATELET
Abs Immature Granulocytes: 0.02 10*3/uL (ref 0.00–0.07)
Basophils Absolute: 0 10*3/uL (ref 0.0–0.1)
Basophils Relative: 0 %
Eosinophils Absolute: 0 10*3/uL (ref 0.0–0.5)
Eosinophils Relative: 1 %
HCT: 40.4 % (ref 36.0–46.0)
Hemoglobin: 13 g/dL (ref 12.0–15.0)
Immature Granulocytes: 0 %
Lymphocytes Relative: 24 %
Lymphs Abs: 1.5 10*3/uL (ref 0.7–4.0)
MCH: 30.8 pg (ref 26.0–34.0)
MCHC: 32.2 g/dL (ref 30.0–36.0)
MCV: 95.7 fL (ref 80.0–100.0)
Monocytes Absolute: 0.6 10*3/uL (ref 0.1–1.0)
Monocytes Relative: 10 %
Neutro Abs: 3.8 10*3/uL (ref 1.7–7.7)
Neutrophils Relative %: 65 %
Platelets: 127 10*3/uL — ABNORMAL LOW (ref 150–400)
RBC: 4.22 MIL/uL (ref 3.87–5.11)
RDW: 12.9 % (ref 11.5–15.5)
WBC: 6 10*3/uL (ref 4.0–10.5)
nRBC: 0 % (ref 0.0–0.2)

## 2020-12-22 LAB — I-STAT BETA HCG BLOOD, ED (MC, WL, AP ONLY): I-stat hCG, quantitative: >2000 m[IU]/mL — ABNORMAL HIGH (ref <5)

## 2020-12-22 NOTE — ED Triage Notes (Signed)
Pt has been having generalized weakness for the past few weeks, denies fevers, some nausea

## 2020-12-22 NOTE — ED Provider Notes (Signed)
  Emergency Medicine Provider in Triage Note   MSE was initiated and I personally evaluated the patient  11:09 PM on Dec 22, 2020 as provider in triage.   Chief Complaint: weakness  HPI  Patient is a 35 y.o. who presets to the ED with complaints of generalized weakness. Reports nausea with poor PO intake x 1.5 weeks. NO emesis, but feels persistently nauseous, having some bloating, concerned she is dehydrated, LMP 1 month ago.    Review of Systems  Positive: Nausea, poor appetite, weakness Negative: Fever, syncope, chest pain, dyspnea  Physical Exam  BP 134/87 (BP Location: Right Arm)   Pulse 84   Temp 97.7 F (36.5 C) (Oral)   Resp 18   SpO2 99%    Gen:   Awake, no distress   HEENT:  Atraumatic  Resp:  Normal effort  Cardiac:  Normal rate  Abd:   No peritoneal signs.  MSK:   Moves extremities without difficulty  Neuro:  Speech clear   Medical Decision Making   Initiation of care has begun. The patient has been counseled on the process, plan, and necessity for staying for the completion/evaluation, informed that the remainder of the evaluation will be completed by another provider, this initial triage assessment does not replace that evaluation, and the importance of remaining in the ED until their evaluation is complete.  Clinical Impression  Generalized weakness       Cherly Anderson, PA-C 12/22/20 2331    Sabas Sous, MD 12/23/20 [phone]

## 2020-12-23 ENCOUNTER — Emergency Department (HOSPITAL_COMMUNITY): Payer: Medicaid Other

## 2020-12-23 ENCOUNTER — Encounter (HOSPITAL_COMMUNITY): Payer: Self-pay | Admitting: Obstetrics & Gynecology

## 2020-12-23 ENCOUNTER — Other Ambulatory Visit: Payer: Self-pay

## 2020-12-23 DIAGNOSIS — D696 Thrombocytopenia, unspecified: Secondary | ICD-10-CM

## 2020-12-23 DIAGNOSIS — O210 Mild hyperemesis gravidarum: Secondary | ICD-10-CM | POA: Diagnosis not present

## 2020-12-23 DIAGNOSIS — O26891 Other specified pregnancy related conditions, first trimester: Secondary | ICD-10-CM | POA: Diagnosis not present

## 2020-12-23 DIAGNOSIS — R109 Unspecified abdominal pain: Secondary | ICD-10-CM | POA: Diagnosis not present

## 2020-12-23 DIAGNOSIS — R531 Weakness: Secondary | ICD-10-CM

## 2020-12-23 DIAGNOSIS — Z3A01 Less than 8 weeks gestation of pregnancy: Secondary | ICD-10-CM

## 2020-12-23 DIAGNOSIS — O99111 Other diseases of the blood and blood-forming organs and certain disorders involving the immune mechanism complicating pregnancy, first trimester: Secondary | ICD-10-CM

## 2020-12-23 LAB — COMPREHENSIVE METABOLIC PANEL
ALT: 12 U/L (ref 0–44)
AST: 23 U/L (ref 15–41)
Albumin: 3.6 g/dL (ref 3.5–5.0)
Alkaline Phosphatase: 48 U/L (ref 38–126)
Anion gap: 9 (ref 5–15)
BUN: 12 mg/dL (ref 6–20)
CO2: 21 mmol/L — ABNORMAL LOW (ref 22–32)
Calcium: 9.5 mg/dL (ref 8.9–10.3)
Chloride: 106 mmol/L (ref 98–111)
Creatinine, Ser: 0.78 mg/dL (ref 0.44–1.00)
GFR, Estimated: >60 mL/min (ref >60)
Glucose, Bld: 103 mg/dL — ABNORMAL HIGH (ref 70–99)
Potassium: 4.4 mmol/L (ref 3.5–5.1)
Sodium: 136 mmol/L (ref 135–145)
Total Bilirubin: 0.7 mg/dL (ref 0.3–1.2)
Total Protein: 7.1 g/dL (ref 6.5–8.1)

## 2020-12-23 LAB — LIPASE, BLOOD: Lipase: 33 U/L (ref 11–51)

## 2020-12-23 LAB — URINALYSIS, ROUTINE W REFLEX MICROSCOPIC
Bilirubin Urine: NEGATIVE
Glucose, UA: NEGATIVE mg/dL
Hgb urine dipstick: NEGATIVE
Ketones, ur: 20 mg/dL — AB
Nitrite: NEGATIVE
Protein, ur: NEGATIVE mg/dL
Specific Gravity, Urine: 1.034 — ABNORMAL HIGH (ref 1.005–1.030)
pH: 5 (ref 5.0–8.0)

## 2020-12-23 LAB — POCT PREGNANCY, URINE: Preg Test, Ur: POSITIVE — AB

## 2020-12-23 MED ORDER — PROMETHAZINE HCL 25 MG PO TABS: 25.0000 mg | ORAL_TABLET | Freq: Four times a day (QID) | ORAL | 2 refills | Status: DC | PRN

## 2020-12-23 MED ORDER — SODIUM CHLORIDE 0.9 % IV SOLN
25.0000 mg | Freq: Once | INTRAVENOUS | Status: AC
  Administered 2020-12-23: 25 mg via INTRAVENOUS
  Filled 2020-12-23: qty 1

## 2020-12-23 MED ORDER — PRENATAL VITAMIN 27-0.8 MG PO TABS: 1.0000 | ORAL_TABLET | Freq: Every day | ORAL | 12 refills | Status: DC

## 2020-12-23 NOTE — ED Provider Notes (Signed)
Patient to ED with nausea and vomiting, unable to tolerate PO's. No abdominal pain, fever. LMP 11/12/20.   VSS Abd nontender  Discussed with Hilda Lias at [facility] who accepts the patient for transfer.    Elpidio Anis, PA-C 12/23/20 0040    Alvira Monday, MD 12/23/20 [phone]

## 2020-12-23 NOTE — Discharge Instructions (Signed)
Obstetrics: Normal and Problem Pregnancies (7th ed., pp. 102-121). [place], [place]: Elsevier."> Textbook of Family Medicine (9th ed., pp. [phone]). [place], [place]: Else[employer].">  First Trimester of Pregnancy  The first trimester of pregnancy starts on the first day of your last menstrual period until the end of week 12. This is months 1 through 3 of pregnancy. A week after a sperm fertilizes an egg, the egg will implant into the wall of the uterus and begin to develop into a baby. By the end of 12 weeks, all the baby's organs will be formed and the baby will be 2-3 inches in size. Body changes during your first trimester Your body goes through many changes during pregnancy. The changes vary and generally return to normal after your baby is born. Physical changes  You may gain or lose weight.  Your breasts may begin to grow larger and become tender. The tissue that surrounds your nipples (areola) may become darker.  Dark spots or blotches (chloasma or mask of pregnancy) may develop on your face.  You may have changes in your hair. These can include thickening or thinning of your hair or changes in texture. Health changes  You may feel nauseous, and you may vomit.  You may have heartburn.  You may develop headaches.  You may develop constipation.  Your gums may bleed and may be sensitive to brushing and flossing. Other changes  You may tire easily.  You may urinate more often.  Your menstrual periods will stop.  You may have a loss of appetite.  You may develop cravings for certain kinds of food.  You may have changes in your emotions from day to day.  You may have more vivid and strange dreams. Follow these instructions at home: Medicines  Follow your health care provider's instructions regarding medicine use. Specific medicines may be either safe or unsafe to take during pregnancy. Do not take any medicines unless told to by your health care provider.  Take a  prenatal vitamin that contains at least 600 micrograms (mcg) of folic acid. Eating and drinking  Eat a healthy diet that includes fresh fruits and vegetables, whole grains, good sources of protein such as meat, eggs, or tofu, and low-fat dairy products.  Avoid raw meat and unpasteurized juice, milk, and cheese. These carry germs that can harm you and your baby.  If you feel nauseous or you vomit: ? Eat 4 or 5 small meals a day instead of 3 large meals. ? Try eating a few soda crackers. ? Drink liquids between meals instead of during meals.  You may need to take these actions to prevent or treat constipation: ? Drink enough fluid to keep your urine pale yellow. ? Eat foods that are high in fiber, such as beans, whole grains, and fresh fruits and vegetables. ? Limit foods that are high in fat and processed sugars, such as fried or sweet foods. Activity  Exercise only as directed by your health care provider. Most people can continue their usual exercise routine during pregnancy. Try to exercise for 30 minutes at least 5 days a week.  Stop exercising if you develop pain or cramping in the lower abdomen or lower back.  Avoid exercising if it is very hot or humid or if you are at high altitude.  Avoid heavy lifting.  If you choose to, you may have sex unless your health care provider tells you not to. Relieving pain and discomfort  Wear a good support bra to relieve breast  tenderness.  Rest with your legs elevated if you have leg cramps or low back pain.  If you develop bulging veins (varicose veins) in your legs: ? Wear support hose as told by your health care provider. ? Elevate your feet for 15 minutes, 3-4 times a day. ? Limit salt in your diet. Safety  Wear your seat belt at all times when driving or riding in a car.  Talk with your health care provider if someone is verbally or physically abusive to you.  Talk with your health care provider if you are feeling sad or have  thoughts of hurting yourself. Lifestyle  Do not use hot tubs, steam rooms, or saunas.  Do not douche. Do not use tampons or scented sanitary pads.  Do not use herbal remedies, alcohol, illegal drugs, or medicines that are not approved by your health care provider. Chemicals in these products can harm your baby.  Do not use any products that contain nicotine or tobacco, such as cigarettes, e-cigarettes, and chewing tobacco. If you need help quitting, ask your health care provider.  Avoid cat litter boxes and soil used by cats. These carry germs that can cause birth defects in the baby and possibly loss of the unborn baby (fetus) by miscarriage or stillbirth. General instructions  During routine prenatal visits in the first trimester, your health care provider will do a physical exam, perform necessary tests, and ask you how things are going. Keep all follow-up visits. This is important.  Ask for help if you have counseling or nutritional needs during pregnancy. Your health care provider can offer advice or refer you to specialists for help with various needs.  Schedule a dentist appointment. At home, brush your teeth with a soft toothbrush. Floss gently.  Write down your questions. Take them to your prenatal visits. Where to find more information  American Pregnancy Association: americanpregnancy.org  American College of Obstetricians and Gynecologists: acog.org/en/Womens%20Health/Pregnancy  Office on Women's Health: womenshealth.gov/pregnancy Contact a health care provider if you have:  Dizziness.  A fever.  Mild pelvic cramps, pelvic pressure, or nagging pain in the abdominal area.  Nausea, vomiting, or diarrhea that lasts for 24 hours or longer.  A bad-smelling vaginal discharge.  Pain when you urinate.  Known exposure to a contagious illness, such as chickenpox, measles, Zika virus, HIV, or hepatitis. Get help right away if you have:  Spotting or bleeding from your  vagina.  Severe abdominal cramping or pain.  Shortness of breath or chest pain.  Any kind of trauma, such as from a fall or a car crash.  New or increased pain, swelling, or redness in an arm or leg. Summary  The first trimester of pregnancy starts on the first day of your last menstrual period until the end of week 12 (months 1 through 3).  Eating 4 or 5 small meals a day rather than 3 large meals may help to relieve nausea and vomiting.  Do not use any products that contain nicotine or tobacco, such as cigarettes, e-cigarettes, and chewing tobacco. If you need help quitting, ask your health care provider.  Keep all follow-up visits. This is important. This information is not intended to replace advice given to you by your health care provider. Make sure you discuss any questions you have with your health care provider. Document Revised: 01/07/2020 Document Reviewed: 11/13/2019 Elsevier Patient Education  2021 Elsevier Inc.  Morning Sickness  Morning sickness is when a woman feels nauseous during pregnancy. This nauseous feeling may or   may not come with vomiting. It often occurs in the morning, but it can be a problem at any time of day. Morning sickness is most common during the first trimester. In some cases, it may continue throughout pregnancy. Although morning sickness is unpleasant, it is usually harmless unless the woman develops severe and continual vomiting (hyperemesis gravidarum), a condition that requires more intense treatment. What are the causes? The exact cause of this condition is not known, but it seems to be related to normal hormonal changes that occur in pregnancy. What increases the risk? You are more likely to develop this condition if:  You experienced nausea or vomiting before your pregnancy.  You had morning sickness during a previous pregnancy.  You are pregnant with more than one baby, such as twins. What are the signs or symptoms? Symptoms of this  condition include:  Nausea.  Vomiting. How is this diagnosed? This condition is usually diagnosed based on your signs and symptoms. How is this treated? In many cases, treatment is not needed for this condition. Making some changes to what you eat may help to control symptoms. Your health care provider may also prescribe or recommend:  Vitamin B6 supplements.  Anti-nausea medicines.  Ginger. Follow these instructions at home: Medicines  Take over-the-counter and prescription medicines only as told by your health care provider. Do not use any prescription, over-the-counter, or herbal medicines for morning sickness without first talking with your health care provider.  Take multivitamins before getting pregnant. This can prevent or decrease the severity of morning sickness in most women. Eating and drinking  Eat a piece of dry toast or crackers before getting out of bed in the morning.  Eat 5 or 6 small meals a day.  Eat dry and bland foods, such as rice or a baked potato. Foods that are high in carbohydrates are often helpful.  Avoid greasy, fatty, and spicy foods.  Have someone cook for you if the smell of any food causes nausea and vomiting.  If you feel nauseous after taking prenatal vitamins, take the vitamins at night or with a snack.  Eat a protein snack between meals if you are hungry. Nuts, yogurt, and cheese are good options.  Drink fluids throughout the day.  Try ginger ale made with real ginger, ginger tea made from fresh grated ginger, or ginger candies. General instructions  Do not use any products that contain nicotine or tobacco. These products include cigarettes, chewing tobacco, and vaping devices, such as e-cigarettes. If you need help quitting, ask your health care provider.  Get an air purifier to keep the air in your house free of odors.  Get plenty of fresh air.  Try to avoid odors that trigger your nausea.  Consider trying these methods to help  relieve symptoms: ? Wearing an acupressure wristband. These wristbands are often worn for seasickness. ? Acupuncture. Contact a health care provider if:  Your home remedies are not working and you need medicine.  You feel dizzy or light-headed.  You are losing weight. Get help right away if:  You have persistent and uncontrolled nausea and vomiting.  You faint.  You have severe pain in your abdomen. Summary  Morning sickness is when a woman feels nauseous during pregnancy. This nauseous feeling may or may not come with vomiting.  Morning sickness is most common during the first trimester.  It often occurs in the morning, but it can be a problem at any time of day.  In many cases, treatment   is not needed for this condition. Making some changes to what you eat may help to control symptoms. This information is not intended to replace advice given to you by your health care provider. Make sure you discuss any questions you have with your health care provider. Document Revised: 03/15/2020 Document Reviewed: 02/23/2020 Elsevier Patient Education  2021 Elsevier Inc.  

## 2020-12-23 NOTE — MAU Provider Note (Signed)
Chief Complaint: Weakness   Event Date/Time   First Provider Initiated Contact with Patient 12/23/20 0216        SUBJECTIVE HPI: Amy Case is a 35 y.o. [id number] at [redacted]w[redacted]d by LMP who presents to maternity admissions reporting nausea, vomiting and weakness.  Denies pain. She denies vaginal bleeding, vaginal itching/burning, urinary symptoms, h/a, dizziness, n/v, or fever/chills.    Weakness This is a new problem. The current episode started 1 to 4 weeks ago. The problem occurs constantly. The problem has been unchanged. Associated symptoms include nausea, vomiting and weakness. Pertinent negatives include no abdominal pain, chills, congestion, fever or headaches. Nothing aggravates the symptoms. She has tried nothing for the symptoms.  Emesis  This is a new problem. The current episode started 1 to 4 weeks ago. The problem has been unchanged. There has been no fever. Pertinent negatives include no abdominal pain, chills, dizziness, fever or headaches. She has tried nothing for the symptoms.   RN Note: Patient to ED with nausea and vomiting, unable to tolerate PO's. No abdominal pain, fever. LMP 11/12/20.  Past Medical History:  Diagnosis Date  . Colitis    History reviewed. No pertinent surgical history. Social History   Socioeconomic History  . Marital status: Single    Spouse name: Not on file  . Number of children: Not on file  . Years of education: Not on file  . Highest education level: Not on file  Occupational History  . Not on file  Tobacco Use  . Smoking status: Current Every Day Smoker  . Smokeless tobacco: Never Used  Substance and Sexual Activity  . Alcohol use: Yes    Comment: a drink or two a month  . Drug use: No  . Sexual activity: Not on file  Other Topics Concern  . Not on file  Social History Narrative  . Not on file   Social Determinants of Health   Financial Resource Strain: Not on file  Food Insecurity: Not on file  Transportation Needs: Not on  file  Physical Activity: Not on file  Stress: Not on file  Social Connections: Not on file  Intimate Partner Violence: Not on file   No current facility-administered medications on file prior to encounter.   Current Outpatient Medications on File Prior to Encounter  Medication Sig Dispense Refill  . chlorhexidine (PERIDEX) 0.12 % solution Use as directed 15 mLs in the mouth or throat 2 (two) times daily. (Patient not taking: Reported on 11/18/2020) 120 mL 0  . HYDROcodone-acetaminophen (NORCO/VICODIN) 5-325 MG tablet Take 1 tablet by mouth every 4 (four) hours as needed. (Patient not taking: Reported on 11/18/2020)    . trimethoprim-polymyxin b (POLYTRIM) ophthalmic solution SMARTSIG:1 Drop(s) Left Eye Every 4 Hours (Patient not taking: Reported on 11/18/2020)    . [DISCONTINUED] metoCLOPramide (REGLAN) 10 MG tablet Take 1 tablet (10 mg total) by mouth every 8 (eight) hours as needed for nausea (headache). (Patient not taking: Reported on 05/20/2020) 10 tablet 0   No Known Allergies  I have reviewed patient's Past Medical Hx, Surgical Hx, Family Hx, Social Hx, medications and allergies.   ROS:  Review of Systems  Constitutional: Negative for chills and fever.  HENT: Negative for congestion.   Gastrointestinal: Positive for nausea and vomiting. Negative for abdominal pain.  Neurological: Positive for weakness. Negative for dizziness and headaches.   Review of Systems  Other systems negative   Physical Exam  Physical Exam Patient Vitals for the past 24 hrs:  BP  Temp Temp src Pulse Resp SpO2  12/23/20 0210 (!) 101/57 99 F (37.2 C) Oral 62 17 100 %  12/22/20 2254 134/87 97.7 F (36.5 C) Oral 84 18 99 %   Constitutional: Well-developed, well-nourished female in no acute distress.  Cardiovascular: normal rate Respiratory: normal effort GI: Abd soft, non-tender. Pos BS x 4 MS: Extremities nontender, no edema, normal ROM Neurologic: Alert and oriented x 4.  GU: Neg CVAT.  LAB  RESULTS Results for orders placed or performed during the hospital encounter of 12/22/20 (from the past 24 hour(s))  Urinalysis, Routine w reflex microscopic Urine, Clean Catch     Status: Abnormal   Collection Time: 12/22/20 11:07 PM  Result Value Ref Range   Color, Urine YELLOW YELLOW   APPearance HAZY (A) CLEAR   Specific Gravity, Urine 1.034 (H) 1.005 - 1.030   pH 5.0 5.0 - 8.0   Glucose, UA NEGATIVE NEGATIVE mg/dL   Hgb urine dipstick NEGATIVE NEGATIVE   Bilirubin Urine NEGATIVE NEGATIVE   Ketones, ur 20 (A) NEGATIVE mg/dL   Protein, ur NEGATIVE NEGATIVE mg/dL   Nitrite NEGATIVE NEGATIVE   Leukocytes,Ua SMALL (A) NEGATIVE   RBC / HPF 0-5 0 - 5 RBC/hpf   WBC, UA 0-5 0 - 5 WBC/hpf   Bacteria, UA RARE (A) NONE SEEN   Squamous Epithelial / LPF 11-20 0 - 5   Mucus PRESENT    Hyaline Casts, UA PRESENT   CBC with Differential     Status: Abnormal   Collection Time: 12/22/20 11:21 PM  Result Value Ref Range   WBC 6.0 4.0 - 10.5 K/uL   RBC 4.22 3.87 - 5.11 MIL/uL   Hemoglobin 13.0 12.0 - 15.0 g/dL   HCT 75.1 02.5 - 85.2 %   MCV 95.7 80.0 - 100.0 fL   MCH 30.8 26.0 - 34.0 pg   MCHC 32.2 30.0 - 36.0 g/dL   RDW 77.8 24.2 - 35.3 %   Platelets 127 (L) 150 - 400 K/uL   nRBC 0.0 0.0 - 0.2 %   Neutrophils Relative % 65 %   Neutro Abs 3.8 1.7 - 7.7 K/uL   Lymphocytes Relative 24 %   Lymphs Abs 1.5 0.7 - 4.0 K/uL   Monocytes Relative 10 %   Monocytes Absolute 0.6 0.1 - 1.0 K/uL   Eosinophils Relative 1 %   Eosinophils Absolute 0.0 0.0 - 0.5 K/uL   Basophils Relative 0 %   Basophils Absolute 0.0 0.0 - 0.1 K/uL   Immature Granulocytes 0 %   Abs Immature Granulocytes 0.02 0.00 - 0.07 K/uL  Comprehensive metabolic panel     Status: Abnormal   Collection Time: 12/22/20 11:21 PM  Result Value Ref Range   Sodium 136 135 - 145 mmol/L   Potassium 4.4 3.5 - 5.1 mmol/L   Chloride 106 98 - 111 mmol/L   CO2 21 (L) 22 - 32 mmol/L   Glucose, Bld 103 (H) 70 - 99 mg/dL   BUN 12 6 - 20 mg/dL    Creatinine, Ser 6.14 0.44 - 1.00 mg/dL   Calcium 9.5 8.9 - 43.1 mg/dL   Total Protein 7.1 6.5 - 8.1 g/dL   Albumin 3.6 3.5 - 5.0 g/dL   AST 23 15 - 41 U/L   ALT 12 0 - 44 U/L   Alkaline Phosphatase 48 38 - 126 U/L   Total Bilirubin 0.7 0.3 - 1.2 mg/dL   GFR, Estimated >54 >00 mL/min   Anion gap 9 5 - 15  Lipase, blood     Status: None   Collection Time: 12/22/20 11:21 PM  Result Value Ref Range   Lipase 33 11 - 51 U/L  I-Stat beta hCG blood, ED     Status: Abnormal   Collection Time: 12/22/20 11:46 PM  Result Value Ref Range   I-stat hCG, quantitative >2,000.0 (H) <5 mIU/mL   Comment 3          Pregnancy, urine POC     Status: Abnormal   Collection Time: 12/23/20  1:55 AM  Result Value Ref Range   Preg Test, Ur POSITIVE (A) NEGATIVE       IMAGING US OB LESS THAN 14 WEEKS WITH OB TRANSVAGINAL  Result Date: 12/23/2020 CLINICAL DATA:  Initial evaluation for acute pain, early pregnancy. EXAM: OBSTETRIC <14 WK [place] AND TRANSVAGINAL OB US TECHNIQUE: Both transabdominal and transvaginal ultrasound examinations were performed for complete evaluation of the gestation as well as the maternal uterus, adnexal regions, and pelvic cul-de-sac. Transvaginal technique was performed to assess early pregnancy. COMPARISON:  None available. FINDINGS: Intrauterine gestational sac: Single Yolk sac:  Present Embryo:  Present Cardiac Activity: Present Heart Rate: 123 bpm CRL: 6.1 mm   6 w   3 d                  [place] EDC: 08/15/2021 Subchorionic hemorrhage:  None visualized. Maternal uterus/adnexae: Left ovary within normal limits. Right ovary not well seen. No adnexal mass or free fluid. IMPRESSION: 1. Single viable intrauterine pregnancy as above, estimated gestational age [redacted] weeks and 3 days by crown-rump length, with ultrasound EDC of 08/15/2021. No complication. 2. No other acute maternal uterine or adnexal abnormality. Electronically Signed   By: Rise Mu M.D.   On: 12/23/2020 03:46    MAU  Management/MDM: Ordered baseline Ultrasound to rule out ectopic and confirm viability. Ordered IV fluids with Phenergan for rehydration and control of nausea Patient stated she felt much better afterward, though sleepy Discharged home States she went to the "[facility]" clinic with prior pregnancies.  Will give her number for [facility].   ASSESSMENT 1. Abdominal pain affecting pregnancy   2. Weakness   3. Non-intractable vomiting with nausea, unspecified vomiting type   4. Thrombocytopenia (HCC)     PLAN Discharge home Rx sent for Promethazine for nausea Rx sent for Prenatal vitamins Encouraged to start [facility]  Pt stable at time of discharge. Encouraged to return here if she develops worsening of symptoms, increase in pain, fever, or other concerning symptoms.    Wynelle Bourgeois CNM, MSN Certified Nurse-Midwife 12/23/2020  2:59 AM

## 2021-01-12 ENCOUNTER — Encounter: Payer: Self-pay | Admitting: Family Medicine

## 2021-01-12 ENCOUNTER — Emergency Department (INDEPENDENT_AMBULATORY_CARE_PROVIDER_SITE_OTHER)
Admission: EM | Admit: 2021-01-12 | Discharge: 2021-01-12 | Disposition: A | Discharge status: Home / Self Care | Payer: Medicaid Other | Source: Home / Self Care

## 2021-01-12 ENCOUNTER — Other Ambulatory Visit: Payer: Self-pay

## 2021-01-12 DIAGNOSIS — H02846 Edema of left eye, unspecified eyelid: Secondary | ICD-10-CM | POA: Diagnosis not present

## 2021-01-12 DIAGNOSIS — H1032 Unspecified acute conjunctivitis, left eye: Secondary | ICD-10-CM | POA: Diagnosis not present

## 2021-01-12 MED ORDER — PREDNISONE 20 MG PO TABS: ORAL_TABLET | ORAL | 0 refills | Status: DC

## 2021-01-12 MED ORDER — MOXIFLOXACIN HCL 0.5 % OP SOLN: 1.0000 [drp] | Freq: Three times a day (TID) | OPHTHALMIC | 0 refills | Status: AC

## 2021-01-12 NOTE — ED Triage Notes (Signed)
Pt presents to Urgent Care with c/o L eye pain and redness since yesterday. Used "Pink Eye Relief" from [facility] last night and states it was worse this morning.

## 2021-01-12 NOTE — Discharge Instructions (Addendum)
Advised/instructed patient to take medications as directed with food to completion.  Advised/instructed patient not to start oral prednisone burst until tomorrow morning, Thursday, 01/13/2021.  Advised may start eyedrops tonight.  Advised patient to RTC if symptoms worsen and/or unresolved.

## 2021-01-12 NOTE — ED Provider Notes (Signed)
Amy Case CARE    CSN: [id number] Arrival date & time: 01/12/21  1901      History   Chief Complaint Chief Complaint  Patient presents with  . Eye Problem    Left    HPI Amy Case is a 35 y.o. female.   HPI 35 year old female presents with left eye redness and irritation for 1 day.  Reports using OTC pinkeye relief from [facility] last night which made eye worse.  Past Medical History:  Diagnosis Date  . Colitis     There are no problems to display for this patient.   History reviewed. No pertinent surgical history.  OB History    Gravida  5   Para  2   Term  2   Preterm      AB      Living  2     SAB      IAB      Ectopic      Multiple      Live Births               Home Medications    Prior to Admission medications   Medication Sig Start Date End Date Taking? Authorizing Provider  Homeopathic Products ([facility]) Apply to eye.   Yes [provider]  moxifloxacin (VIGAMOX) 0.5 % ophthalmic solution Place 1 drop into the left eye 3 (three) times daily for 5 days. 01/12/21 01/17/21 Yes Trevor Iha, FNP  predniSONE (DELTASONE) 20 MG tablet Take 2 tabs PO daily x 5 days. 01/12/21  Yes Trevor Iha, FNP  Prenatal Vit-Fe Fumarate-FA (PRENATAL VITAMIN) 27-0.8 MG TABS Take 1 tablet by mouth daily. 12/23/20   Aviva Signs, CNM  promethazine (PHENERGAN) 25 MG tablet Take 1 tablet (25 mg total) by mouth every 6 (six) hours as needed for nausea or vomiting. 12/23/20   Aviva Signs, CNM  metoCLOPramide (REGLAN) 10 MG tablet Take 1 tablet (10 mg total) by mouth every 8 (eight) hours as needed for nausea (headache). Patient not taking: Reported on 05/20/2020 12/26/16 05/20/20  Marijean Niemann, MD    Family History Family History  Problem Relation Age of Onset  . Heart attack Mother   . Cancer Father   . Colon cancer Neg Hx   . Pancreatic cancer Neg Hx   . Esophageal cancer Neg Hx     Social History Social History    Tobacco Use  . Smoking status: Current Every Day Smoker    Packs/day: 0.50  . Smokeless tobacco: Never Used  Substance Use Topics  . Alcohol use: Yes    Comment: occasionally  . Drug use: No     Allergies   Patient has no known allergies.   Review of Systems Review of Systems  Constitutional: Negative.   HENT: Negative.   Eyes: Positive for pain and redness.       Left upper/lower eyelid swelling x 3 days  Respiratory: Negative.   Cardiovascular: Negative.   Gastrointestinal: Negative.   Genitourinary: Negative.   Musculoskeletal: Negative.   Skin: Negative.   Neurological: Negative.      Physical Exam Triage Vital Signs ED Triage Vitals  Enc Vitals Group     BP 01/12/21 1917 128/84     Pulse Rate 01/12/21 1917 80     Resp 01/12/21 1917 18     Temp 01/12/21 1917 98.4 F (36.9 C)     Temp Source 01/12/21 1917 Oral     SpO2 01/12/21  1917 97 %     Weight 01/12/21 1913 170 lb (77.1 kg)     Height 01/12/21 1913 5\' 9"  (1.753 m)     Head Circumference --      Peak Flow --      Pain Score 01/12/21 1913 0     Pain Loc --      Pain Edu? --      Excl. in GC? --    No data found.  Updated Vital Signs BP 128/84 (BP Location: Right Arm)   Pulse 80   Temp 98.4 F (36.9 C) (Oral)   Resp 18   Ht 5\' 9"  (1.753 m)   Wt 170 lb (77.1 kg)   LMP 11/10/2020 Comment: Recent abortion  SpO2 97%   BMI 25.10 kg/m      Physical Exam Vitals and nursing note reviewed.  Constitutional:      General: She is not in acute distress.    Appearance: Normal appearance. She is normal weight. She is not ill-appearing.  HENT:     Head: Normocephalic and atraumatic.     Mouth/Throat:     Mouth: Mucous membranes are moist.     Pharynx: Oropharynx is clear.  Eyes:     Extraocular Movements: Extraocular movements intact.     Pupils: Pupils are equal, round, and reactive to light.     Comments: Left conjunctivae +3 injection, mild swelling of left lower/upper eyelid noted   Cardiovascular:     Rate and Rhythm: Normal rate and regular rhythm.     Pulses: Normal pulses.     Heart sounds: Normal heart sounds.  Pulmonary:     Effort: Pulmonary effort is normal. No respiratory distress.     Breath sounds: No wheezing, rhonchi or rales.  Musculoskeletal:        General: Normal range of motion.     Cervical back: Normal range of motion and neck supple. No tenderness.  Lymphadenopathy:     Cervical: No cervical adenopathy.  Skin:    General: Skin is warm and dry.  Neurological:     General: No focal deficit present.     Mental Status: She is alert and oriented to person, place, and time.  Psychiatric:        Mood and Affect: Mood normal.        Behavior: Behavior normal.      UC Treatments / Results  Labs (all labs ordered are listed, but only abnormal results are displayed) Labs Reviewed - No data to display  EKG   Radiology No results found.  Procedures Procedures (including critical care time)  Medications Ordered in UC Medications - No data to display  Initial Impression / Assessment and Plan / UC Course  I have reviewed the triage vital signs and the nursing notes.  Pertinent labs & imaging results that were available during my care of the patient were reviewed by me and considered in my medical decision making (see chart for details).     MDM: 1.  Bacterial conjunctivitis of left eye.  2.  Left lower eyelid swelling.  Patient discharged home, hemodynamically stable. Final Clinical Impressions(s) / UC Diagnoses   Final diagnoses:  Acute bacterial conjunctivitis of left eye  Swelling of gland of left eyelid     Discharge Instructions     Advised/instructed patient to take medications as directed with food to completion.  Advised/instructed patient not to start oral prednisone burst until tomorrow morning, Thursday, 01/13/2021.  Advised may start eyedrops  tonight.  Advised patient to RTC if symptoms worsen and/or  unresolved.    ED Prescriptions    Medication Sig Dispense Auth. Provider   moxifloxacin (VIGAMOX) 0.5 % ophthalmic solution Place 1 drop into the left eye 3 (three) times daily for 5 days. 3 mL Trevor Iha, FNP   predniSONE (DELTASONE) 20 MG tablet Take 2 tabs PO daily x 5 days. 10 tablet Trevor Iha, FNP     PDMP not reviewed this encounter.   Trevor Iha, FNP 01/12/21 1941

## 2021-01-13 ENCOUNTER — Encounter: Payer: Self-pay | Admitting: Sports Medicine

## 2021-01-13 ENCOUNTER — Ambulatory Visit (INDEPENDENT_AMBULATORY_CARE_PROVIDER_SITE_OTHER): Payer: Medicaid Other | Admitting: Sports Medicine

## 2021-01-13 DIAGNOSIS — M79674 Pain in right toe(s): Secondary | ICD-10-CM

## 2021-01-13 DIAGNOSIS — L603 Nail dystrophy: Secondary | ICD-10-CM

## 2021-01-13 NOTE — Progress Notes (Signed)
Subjective: Amy Case is a 35 y.o. female patient seen today in office for fungal culture results. Patient has no other pedal complaints at this time.   There are no problems to display for this patient.   Current Outpatient Medications on File Prior to Visit  Medication Sig Dispense Refill  . Homeopathic Products ([facility]) Apply to eye.    . moxifloxacin (VIGAMOX) 0.5 % ophthalmic solution Place 1 drop into the left eye 3 (three) times daily for 5 days. 3 mL 0  . predniSONE (DELTASONE) 20 MG tablet Take 2 tabs PO daily x 5 days. 10 tablet 0  . Prenatal Vit-Fe Fumarate-FA (PRENATAL VITAMIN) 27-0.8 MG TABS Take 1 tablet by mouth daily. 30 tablet 12  . promethazine (PHENERGAN) 25 MG tablet Take 1 tablet (25 mg total) by mouth every 6 (six) hours as needed for nausea or vomiting. 30 tablet 2  . [DISCONTINUED] metoCLOPramide (REGLAN) 10 MG tablet Take 1 tablet (10 mg total) by mouth every 8 (eight) hours as needed for nausea (headache). (Patient not taking: Reported on 05/20/2020) 10 tablet 0   No current facility-administered medications on file prior to visit.    No Known Allergies  Objective: Physical Exam  General: Well developed, nourished, no acute distress, awake, alert and oriented x 3  Vascular: Dorsalis pedis artery 2/4 bilateral, Posterior tibial artery 2/4 bilateral, skin temperature warm to warm proximal to distal bilateral lower extremities, no varicosities, pedal hair present bilateral.  Neurological: Gross sensation present via light touch bilateral.   Dermatological: Skin is warm, dry, and supple bilateral, right hallux nail, short thick, and discolored with mild subungal debris, no webspace macerations present bilateral, no open lesions present bilateral, no callus/corns/hyperkeratotic tissue present bilateral. No signs of infection bilateral.  Musculoskeletal: No symptomatic boney deformities noted bilateral. Muscular strength within normal limits without  painon range of motion. No pain with calf compression bilateral.  Fungal culture-positive suggestive of onychomycosis  Assessment and Plan:  Problem List Items Addressed This Visit   None   Visit Diagnoses    Onychodystrophy    -  Primary   Toe pain, right          -Examined patient -Discussed treatment options for painful mycotic nails -Patient opt for laser -Advised good hygiene habits -Patient to return as scheduled for laser or sooner if symptoms worsen.  Asencion Islam, DPM

## 2021-03-04 ENCOUNTER — Other Ambulatory Visit: Payer: Medicaid Other

## 2021-03-06 ENCOUNTER — Emergency Department (HOSPITAL_COMMUNITY)
Admission: EM | Admit: 2021-03-06 | Discharge: 2021-03-07 | Disposition: A | Discharge status: Home / Self Care | Payer: Medicaid Other | Attending: Emergency Medicine | Admitting: Emergency Medicine

## 2021-03-06 DIAGNOSIS — Z5321 Procedure and treatment not carried out due to patient leaving prior to being seen by health care provider: Secondary | ICD-10-CM | POA: Diagnosis not present

## 2021-03-06 DIAGNOSIS — H5712 Ocular pain, left eye: Secondary | ICD-10-CM | POA: Diagnosis not present

## 2021-03-06 MED ORDER — FLUORESCEIN SODIUM 1 MG OP STRP: 1.0000 | ORAL_STRIP | Freq: Once | OPHTHALMIC | Status: DC

## 2021-03-06 MED ORDER — TETRACAINE HCL 0.5 % OP SOLN: 2.0000 [drp] | Freq: Once | OPHTHALMIC | Status: DC

## 2021-03-06 NOTE — ED Triage Notes (Signed)
Pt reports redness and pain to L eye for the past 24 hours. Used eyedrops from previous infection and ibuprofen with relief. Since having eye injury in November, pt has had pinkeye and now this in same eye

## 2021-03-06 NOTE — ED Provider Notes (Signed)
Emergency Medicine Provider Triage Evaluation Note  Amy Case , a 35 y.o. female  was evaluated in triage.  Pt complains of left eye pain and swelling onset yesterday, used pinkeye drops from prior infection however pain woke her from her sleep around 2:30 in the morning this morning, improved with ibuprofen.  Reports light sensitivity, swelling, watering and blurry vision.  Does not wear glasses or contacts, denies any injury to the eye..  Review of Systems  Positive: Eye pain, photophobia, watery drainage  Negative: injury  Physical Exam  BP 114/76 (BP Location: Right Arm)   Pulse 66   Temp 98.6 F (37 C) (Oral)   Resp 18   LMP 11/10/2020 Comment: Recent abortion  SpO2 99%  Gen:   Awake, no distress   Resp:  Normal effort  MSK:   Moves extremities without difficulty  Other:  Photophobia, left upper eyelid swollen, left eye injected.  Pupils equal and reactive.  Medical Decision Making  Medically screening exam initiated at 10:24 PM.  Appropriate orders placed.  Amy Case was informed that the remainder of the evaluation will be completed by another provider, this initial triage assessment does not replace that evaluation, and the importance of remaining in the ED until their evaluation is complete.     Jeannie Fend, PA-C 03/06/21 2227    Jacalyn Lefevre, MD 03/06/21 [phone]

## 2021-03-07 NOTE — ED Notes (Signed)
Pt called for vital recheck. No response.  

## 2021-09-18 IMAGING — US US OB < 14 WEEKS - US OB TV
1 series · 15 of 28 positions shown · non-contrast
Comparison: None available.

CLINICAL DATA: Initial evaluation for acute pain, early pregnancy.

EXAM:
OBSTETRIC <14 WK US AND TRANSVAGINAL OB US
TECHNIQUE: Both transabdominal and transvaginal ultrasound examinations were
performed for complete evaluation of the gestation as well as the
maternal uterus, adnexal regions, and pelvic cul-de-sac.
Transvaginal technique was performed to assess early pregnancy.

[Series 1: us ob < 14 weeks - us ob tv · 60 acquisitions, 15 frames shown]
[im 1/60]
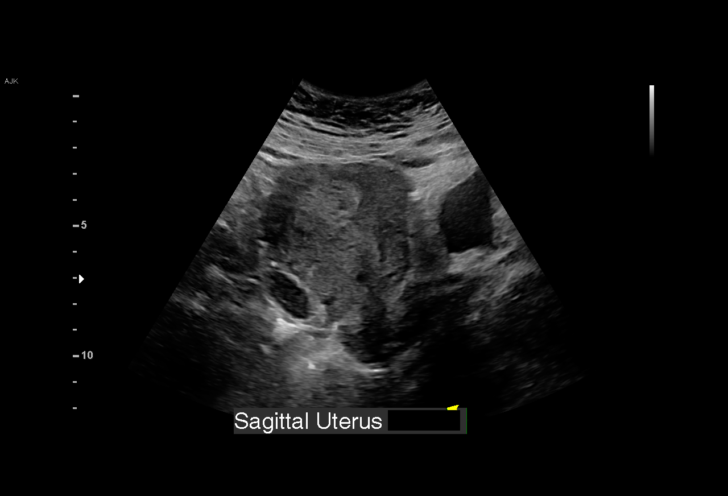
[im 5/60]
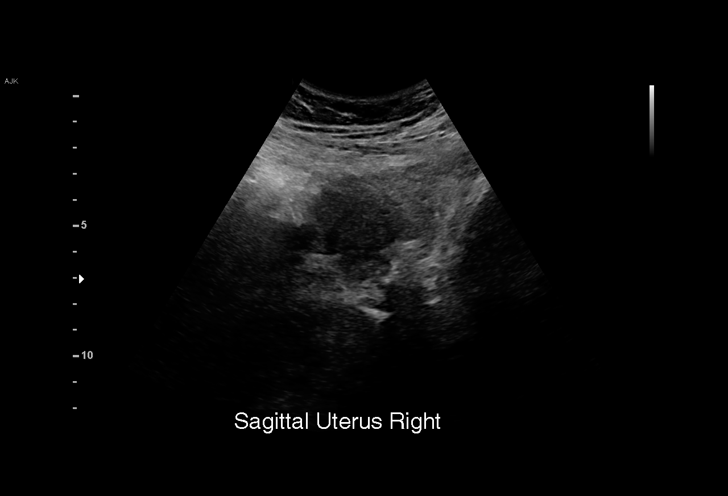
[im 9/60]
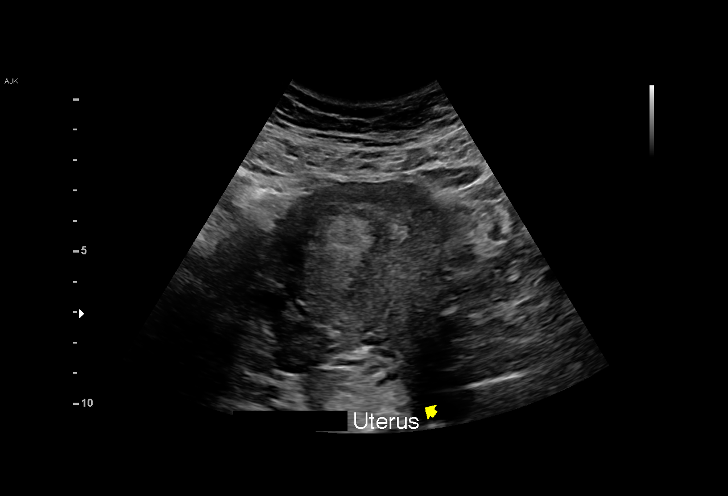
[im 14/60]
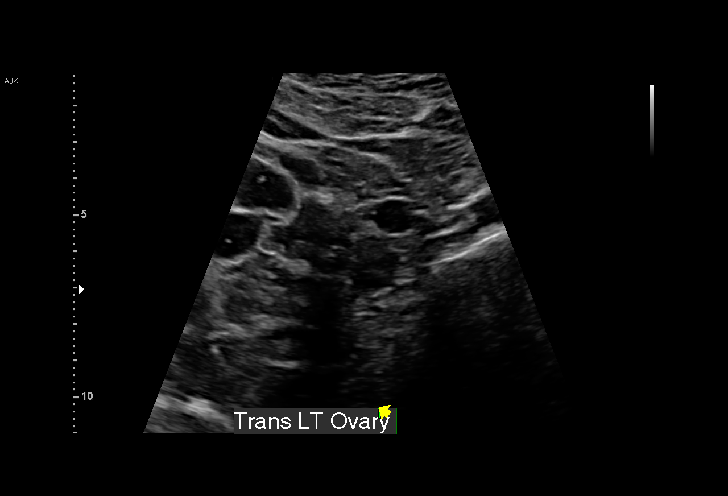
[im 18/60]
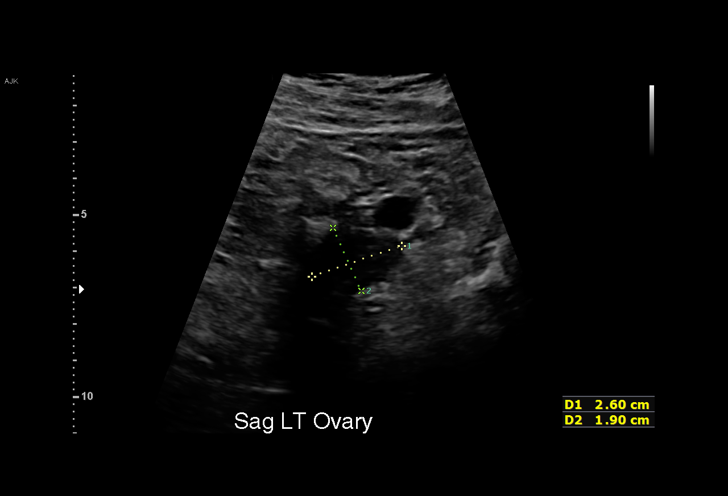
[im 22/60]
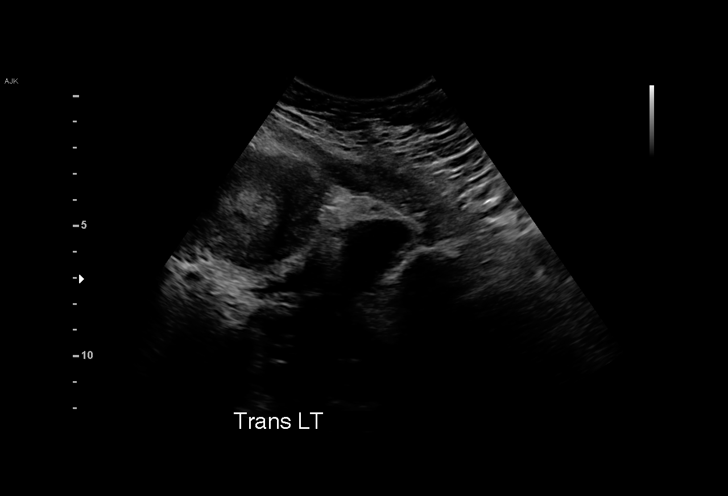
[im 27/60]
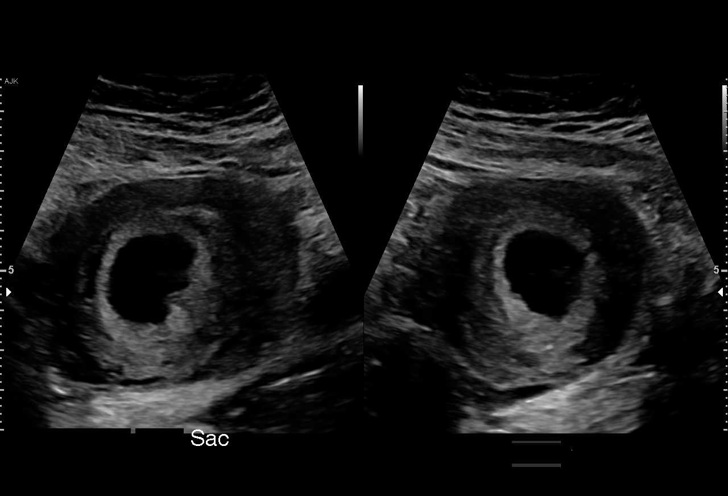
[im 31/60]
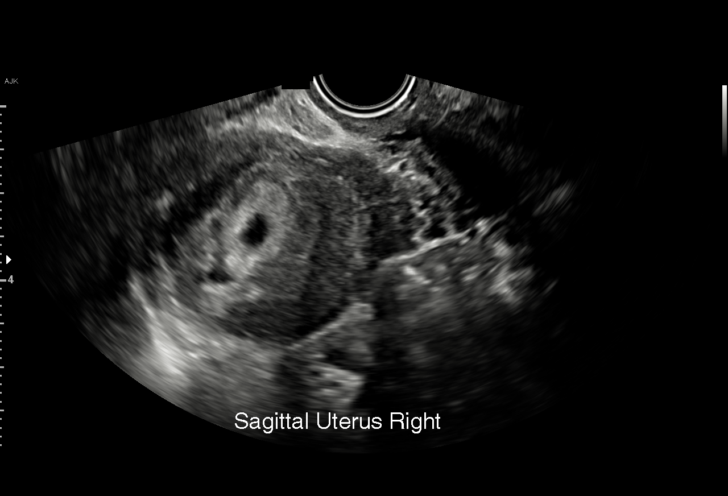
[im 33/60]
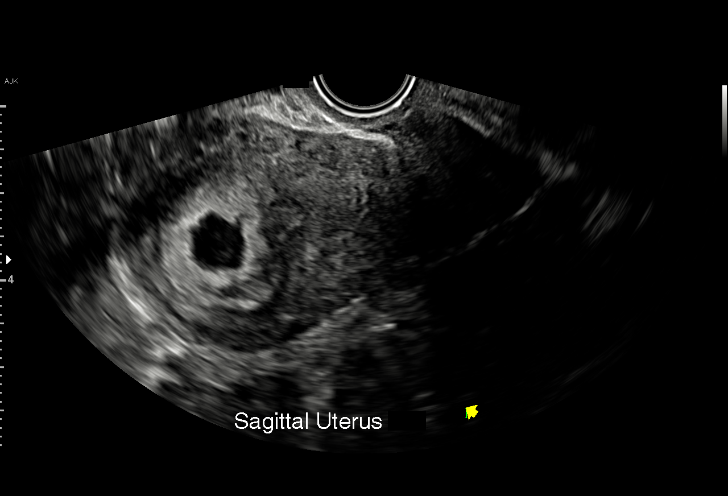
[im 38/60]
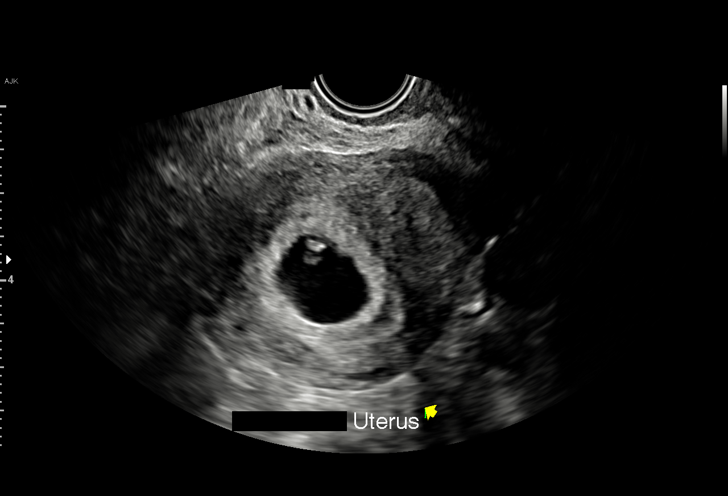
[im 42/60]
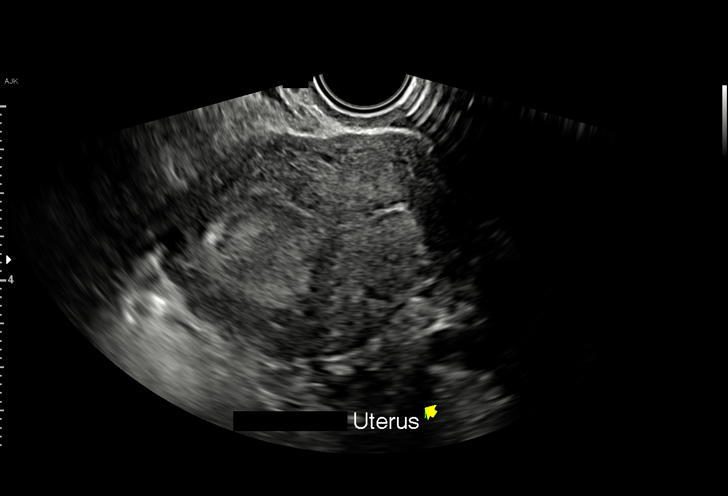
[im 46/60]
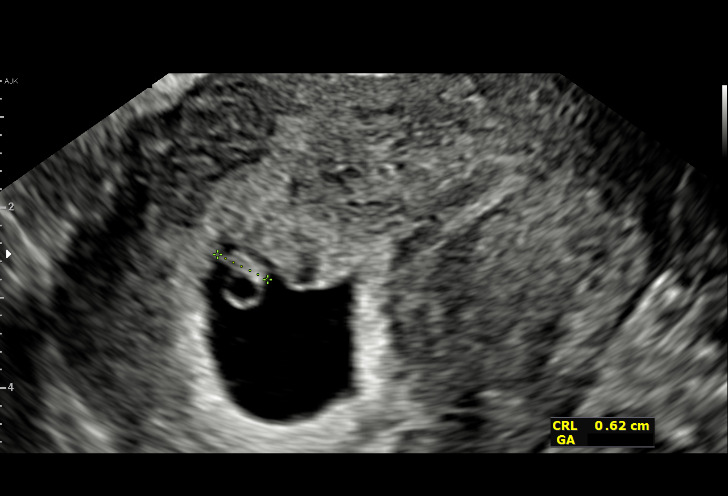
[im 51/60]
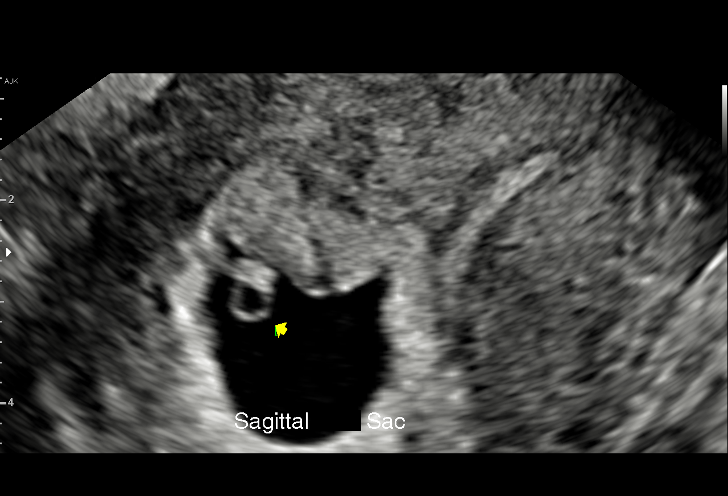
[im 55/60]
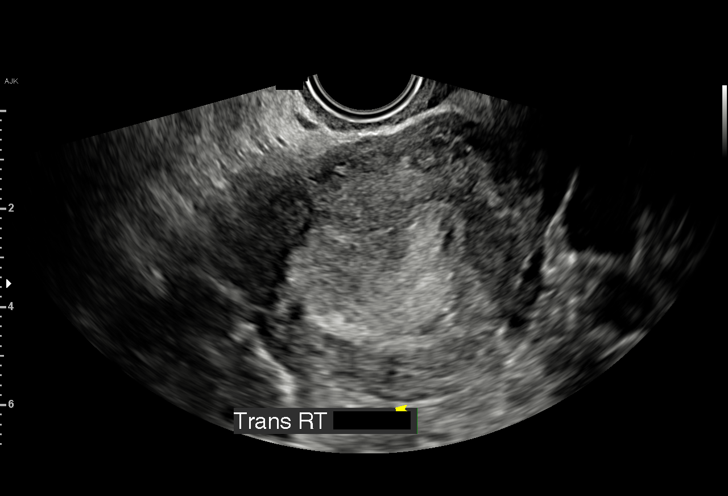
[im 60/60]
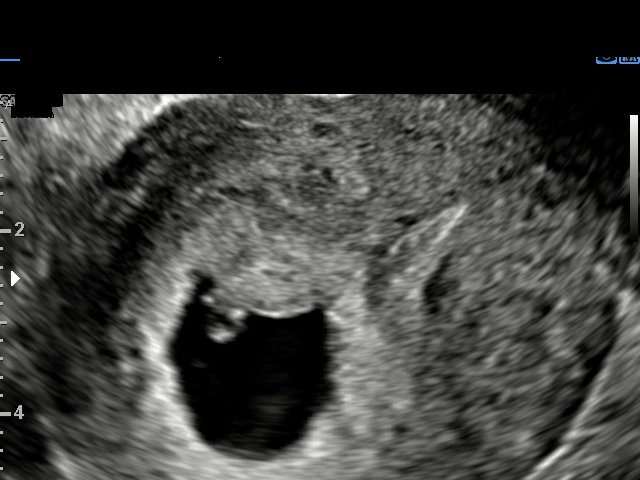

[15 of 28 positions shown; findings below may reference images not displayed]

FINDINGS: Intrauterine gestational sac: Single

Yolk sac:  Present

Embryo:  Present

Cardiac Activity: Present

Heart Rate: 123 bpm

CRL: 6.1 mm   6 w   3 d                  US EDC: 08/15/2021

Subchorionic hemorrhage:  None visualized.

Maternal uterus/adnexae: Left ovary within normal limits. Right
ovary not well seen. No adnexal mass or free fluid.
IMPRESSION: 1. Single viable intrauterine pregnancy as above, estimated
gestational age 6 weeks and 3 days by crown-rump length, with
ultrasound EDC of 08/15/2021. No complication.
2. No other acute maternal uterine or adnexal abnormality.

## 2022-09-01 ENCOUNTER — Encounter: Payer: Self-pay | Admitting: Radiology

## 2022-09-01 ENCOUNTER — Ambulatory Visit (INDEPENDENT_AMBULATORY_CARE_PROVIDER_SITE_OTHER): Payer: Medicaid Other | Admitting: Radiology

## 2022-09-01 VITALS — BP 114/74 | Ht 68.5 in | Wt 194.0 lb

## 2022-09-01 DIAGNOSIS — N898 Other specified noninflammatory disorders of vagina: Secondary | ICD-10-CM

## 2022-09-01 DIAGNOSIS — Z113 Encounter for screening for infections with a predominantly sexual mode of transmission: Secondary | ICD-10-CM

## 2022-09-01 DIAGNOSIS — R350 Frequency of micturition: Secondary | ICD-10-CM | POA: Diagnosis not present

## 2022-09-01 DIAGNOSIS — Z01419 Encounter for gynecological examination (general) (routine) without abnormal findings: Secondary | ICD-10-CM

## 2022-09-01 LAB — URINALYSIS, COMPLETE W/RFL CULTURE
Bacteria, UA: NONE SEEN /HPF
Bilirubin Urine: NEGATIVE
Casts: NONE SEEN /LPF
Crystals: NONE SEEN /HPF
Glucose, UA: NEGATIVE
Hgb urine dipstick: NEGATIVE
Hyaline Cast: NONE SEEN /LPF
Leukocyte Esterase: NEGATIVE
Nitrites, Initial: NEGATIVE
Protein, ur: NEGATIVE
RBC / HPF: NONE SEEN /HPF (ref 0–2)
Specific Gravity, Urine: 1.02 (ref 1.001–1.035)
WBC, UA: NONE SEEN /HPF (ref 0–5)
Yeast: NONE SEEN /HPF
pH: 6 (ref 5.0–8.0)

## 2022-09-01 LAB — NO CULTURE INDICATED

## 2022-09-01 NOTE — Progress Notes (Signed)
Amy Case [birth year] [id number]   History:  37 y.o. G5P2 presents for annual exam as a new patient. She reports a small amount of urine leakage almost daily for the past month- unsure if it is definitely urine, has a foul odor. Would like STI screening today.   Gynecologic History Patient's last menstrual period was 08/23/2022 (approximate). Period Cycle (Days): 28 Period Duration (Days): 5 Period Pattern: Regular Menstrual Flow: Moderate Menstrual Control: Maxi pad Dysmenorrhea: None Contraception/Family planning: none Sexually active: yes Last Pap: 2022 per pt.   Obstetric History OB History  Gravida Para Term Preterm AB Living  5 2 2     2   SAB IAB Ectopic Multiple Live Births               # Outcome Date GA Lbr Len/2nd Weight Sex Delivery Anes PTL Lv  5 Gravida           4 Gravida           3 Gravida           2 Term           1 Term              The following portions of the patient's history were reviewed and updated as appropriate: allergies, current medications, past family history, past medical history, past social history, past surgical history, and problem list.  Review of Systems Pertinent items noted in HPI and remainder of comprehensive ROS otherwise negative.   Past medical history, past surgical history, family history and social history were all reviewed and documented in the EPIC chart.   Exam:  Vitals:   09/01/22 1126  BP: 114/74  Weight: 194 lb (88 kg)  Height: 5' 8.5" (1.74 m)   Body mass index is 29.07 kg/m.  General appearance:  Normal Thyroid:  Symmetrical, normal in size, without palpable masses or nodularity. Respiratory  Auscultation:  Clear without wheezing or rhonchi Cardiovascular  Auscultation:  Regular rate, without rubs, murmurs or gallops  Edema/varicosities:  Not grossly evident Abdominal  Soft,nontender, without masses, guarding or rebound.  Liver/spleen:  No organomegaly noted  Hernia:  None appreciated   Skin  Inspection:  Grossly normal Breasts: Examined lying and sitting.   Right: Without masses, retractions, nipple discharge or axillary adenopathy.   Left: Without masses, retractions, nipple discharge or axillary adenopathy. Genitourinary   Inguinal/mons:  Normal without inguinal adenopathy  External genitalia:  Normal appearing vulva with no masses, tenderness, or lesions  BUS/Urethra/Skene's glands:  Normal without masses or exudate  Vagina:  Normal appearing with normal color and discharge, no lesions  Cervix:  Normal appearing without discharge or lesions  Uterus:  Normal in size, shape and contour.  Mobile, nontender  Adnexa/parametria:     Rt: Normal in size, without masses or tenderness.   Lt: Normal in size, without masses or tenderness.  Anus and perineum: Normal   Patient informed chaperone available to be present for breast and pelvic exam. Patient has requested no chaperone to be present. Patient has been advised what will be completed during breast and pelvic exam.   Assessment/Plan:   1. Well woman exam with routine gynecological exam Pap 2022, repeat 2025 (no records, pt reports normal) Mammo at 38  2. Screening for venereal disease (VD) - SureSwab Advanced Vaginitis Plus,TMA  3. Urinary frequency - Urinalysis,Complete w/RFL Culture  4. Vaginal discharge - SureSwab Advanced Vaginitis Plus,TMA     Discussed SBE, pap and STI screening  as directed/appropriate. Recommend 112mins of exercise weekly, including weight bearing exercise. Encouraged the use of seatbelts and sunscreen. Return in 1 year for annual or as needed.   Rubbie Battiest B WHNP-BC 11:45 AM 09/01/2022

## 2022-09-02 LAB — SURESWAB® ADVANCED VAGINITIS PLUS,TMA
C. trachomatis RNA, TMA: NOT DETECTED
CANDIDA SPECIES: NOT DETECTED
Candida glabrata: NOT DETECTED
N. gonorrhoeae RNA, TMA: NOT DETECTED
SURESWAB(R) ADV BACTERIAL VAGINOSIS(BV),TMA: POSITIVE — AB
TRICHOMONAS VAGINALIS (TV),TMA: NOT DETECTED

## 2022-09-04 ENCOUNTER — Other Ambulatory Visit: Payer: Self-pay | Admitting: *Deleted

## 2022-09-04 MED ORDER — METRONIDAZOLE 500 MG PO TABS: 500.0000 mg | ORAL_TABLET | Freq: Two times a day (BID) | ORAL | 0 refills | Status: DC

## 2023-06-15 ENCOUNTER — Ambulatory Visit (INDEPENDENT_AMBULATORY_CARE_PROVIDER_SITE_OTHER): Payer: Medicaid Other | Admitting: Family Medicine

## 2023-06-15 ENCOUNTER — Encounter: Payer: Self-pay | Admitting: Family Medicine

## 2023-06-15 VITALS — BP 136/84 | HR 93 | Temp 98.7°F | Resp 16 | Ht 70.0 in | Wt 203.8 lb

## 2023-06-15 DIAGNOSIS — Z7689 Persons encountering health services in other specified circumstances: Secondary | ICD-10-CM | POA: Diagnosis not present

## 2023-06-15 DIAGNOSIS — F411 Generalized anxiety disorder: Secondary | ICD-10-CM | POA: Diagnosis not present

## 2023-06-15 DIAGNOSIS — Z0289 Encounter for other administrative examinations: Secondary | ICD-10-CM

## 2023-06-15 NOTE — Progress Notes (Signed)
New Patient Office Visit  Subjective    Patient ID: Amy Case, female    DOB: Jan 12, 1986  Age: 37 y.o. MRN: [id number]  CC:  Chief Complaint  Patient presents with   Follow-up    paperwork    HPI Amy Case presents to establish care and for complaint of anxiety for which she would like an emotional support animal.    Outpatient Encounter Medications as of 06/15/2023  Medication Sig   metroNIDAZOLE (FLAGYL) 500 MG tablet Take 1 tablet (500 mg total) by mouth 2 (two) times daily.  For  7 days,. Take with food, no alcohol. (Patient not taking: Reported on 06/15/2023)   [DISCONTINUED] metoCLOPramide (REGLAN) 10 MG tablet Take 1 tablet (10 mg total) by mouth every 8 (eight) hours as needed for nausea (headache). (Patient not taking: Reported on 05/20/2020)   No facility-administered encounter medications on file as of 06/15/2023.    Past Medical History:  Diagnosis Date   Colitis     History reviewed. No pertinent surgical history.  Family History  Problem Relation Age of Onset   Heart attack Mother    Cancer Father    Colon cancer Neg Hx    Pancreatic cancer Neg Hx    Esophageal cancer Neg Hx     Social History   Socioeconomic History   Marital status: Single    Spouse name: Not on file   Number of children: Not on file   Years of education: Not on file   Highest education level: Not on file  Occupational History   Not on file  Tobacco Use   Smoking status: Every Day    Current packs/day: 0.50    Types: Cigarettes   Smokeless tobacco: Never  Substance and Sexual Activity   Alcohol use: Yes    Comment: occasionally   Drug use: No   Sexual activity: Yes    Partners: Male    Birth control/protection: None    Comment: menarche 37yo, sexual debut 37yo  Other Topics Concern   Not on file  Social History Narrative   Not on file   Social Determinants of Health   Financial Resource Strain: Low Risk  (06/15/2023)   Overall Financial Resource Strain  (CARDIA)    Difficulty of Paying Living Expenses: Not hard at all  Food Insecurity: No Food Insecurity (06/15/2023)   Hunger Vital Sign    Worried About Running Out of Food in the Last Year: Never true    Ran Out of Food in the Last Year: Never true  Transportation Needs: No Transportation Needs (06/15/2023)   PRAPARE - Administrator, Civil Service (Medical): No    Lack of Transportation (Non-Medical): No  Physical Activity: Sufficiently Active (06/15/2023)   Exercise Vital Sign    Days of Exercise per Week: 7 days    Minutes of Exercise per Session: 30 min  Recent Concern: Physical Activity - Insufficiently Active (06/15/2023)   Exercise Vital Sign    Days of Exercise per Week: 2 days    Minutes of Exercise per Session: 30 min  Stress: No Stress Concern Present (06/15/2023)   [employer] of Occupational Health - Occupational Stress Questionnaire    Feeling of Stress : Not at all  Social Connections: Moderately Isolated (06/15/2023)   Social Connection and Isolation Panel [NHANES]    Frequency of Communication with Friends and Family: More than three times a week    Frequency of Social Gatherings with Friends and Family: Once  a week    Attends Religious Services: Never    Active Member of Clubs or Organizations: No    Attends Banker Meetings: Never    Marital Status: Living with partner  Intimate Partner Violence: Not At Risk (06/15/2023)   Humiliation, Afraid, Rape, and Kick questionnaire    Fear of Current or Ex-Partner: No    Emotionally Abused: No    Physically Abused: No    Sexually Abused: No    Review of Systems  Psychiatric/Behavioral:  Negative for depression and suicidal ideas. The patient is nervous/anxious. The patient does not have insomnia.   All other systems reviewed and are negative.       Objective    BP 136/84 (BP Location: Right Arm, Patient Position: Sitting, Cuff Size: Normal)   Pulse 93   Temp 98.7 F (37.1 C) (Oral)    Resp 16   Ht 5\' 10"  (1.778 m)   Wt 203 lb 12.8 oz (92.4 kg)   SpO2 97%   BMI 29.24 kg/m   Physical Exam Vitals and nursing note reviewed.  Constitutional:      General: She is not in acute distress. Cardiovascular:     Rate and Rhythm: Normal rate and regular rhythm.  Pulmonary:     Effort: Pulmonary effort is normal.     Breath sounds: Normal breath sounds.  Neurological:     General: No focal deficit present.     Mental Status: She is alert and oriented to person, place, and time.  Psychiatric:        Mood and Affect: Mood normal.        Behavior: Behavior normal.         Assessment & Plan:   1. Anxiety state Referred to SW for further eval  2. Encounter for completion of form with patient Will complete form as requested if SW is in agreement.   3. Encounter to establish care     Return if symptoms worsen or fail to improve.   Tommie Raymond, MD

## 2023-06-19 ENCOUNTER — Encounter: Payer: Medicaid Other | Admitting: Licensed Clinical Social Worker

## 2023-06-20 ENCOUNTER — Encounter: Payer: Medicaid Other | Admitting: Licensed Clinical Social Worker

## 2023-06-27 ENCOUNTER — Telehealth: Payer: Self-pay | Admitting: Licensed Clinical Social Worker

## 2023-06-27 NOTE — Telephone Encounter (Signed)
Spoke with pt about her missed appointment last week, pt has rescheduled.

## 2023-06-29 ENCOUNTER — Encounter: Payer: Medicaid Other | Admitting: Licensed Clinical Social Worker

## 2023-06-29 ENCOUNTER — Ambulatory Visit: Payer: Medicaid Other | Admitting: Licensed Clinical Social Worker

## 2023-06-29 DIAGNOSIS — F419 Anxiety disorder, unspecified: Secondary | ICD-10-CM

## 2023-07-03 DIAGNOSIS — F419 Anxiety disorder, unspecified: Secondary | ICD-10-CM | POA: Insufficient documentation

## 2023-07-03 NOTE — BH Specialist Note (Signed)
Integrated Behavioral Health via Telemedicine Visit  06/29/2023 Amy Case [id number]  Number of Integrated Behavioral Health Clinician visits: 1- Initial Visit  Session Start time: 1100   Session End time: 1130  Total time in minutes: 30   Referring Provider: PCP Patient/Family location: work Amy Case Provider location: [facility] All persons participating in visit: pt and LCSWA Types of Service: Individual psychotherapy, Telephone visit, and Introduction only  I connected with Amy Case via  Telephone or Engineer, civil (consulting)  (Video is Surveyor, mining) and verified that I am speaking with the correct person using two identifiers. Discussed confidentiality: Yes   I discussed the limitations of telemedicine and the availability of in person appointments.  Discussed there is a possibility of technology failure and discussed alternative modes of communication if that failure occurs.  I discussed that engaging in this telemedicine visit, they consent to the provision of behavioral healthcare and the services will be billed under their insurance.  Patient and/or legal guardian expressed understanding and consented to Telemedicine visit: Yes   Presenting Concerns: Patient and/or family reports the following symptoms/concerns: worry, anxious,sadness  Duration of problem: for a few years; Severity of problem: mild  Patient and/or Family's Strengths/Protective Factors: Social and Emotional competence and Concrete supports in place (healthy food, safe environments, etc.)  Goals Addressed: Patient will:  Reduce symptoms of: anxiety and stress   Increase knowledge and/or ability of: coping skills   Demonstrate ability to: Increase adequate support systems for patient/family  Progress towards Goals: Ongoing  Interventions: Interventions utilized:  Supportive Counseling Standardized Assessments completed: Not Needed  Patient and/or Family Response:  Patient is experiencing some anxiety and symptoms of depression patient is interested in getting a emotional support animal to cope with her feelings.    Assessment: Patient currently experiencing worry, anxious,sadness.    Patient may benefit from continues support from Amy Case.  Plan: Follow up with behavioral health clinician on : 4 weeks Behavioral recommendations: ongoing therapy and a ESA  Referral(s): Integrated [employer] (In Clinic)  I discussed the assessment and treatment plan with the patient and/or parent/guardian. They were provided an opportunity to ask questions and all were answered. They agreed with the plan and demonstrated an understanding of the instructions.   They were advised to call back or seek an in-person evaluation if the symptoms worsen or if the condition fails to improve as anticipated.  Amy Case, LCSWA

## 2023-07-11 NOTE — Telephone Encounter (Signed)
A user error has taken place: encounter opened in error, closed for administrative reasons.

## 2023-08-03 ENCOUNTER — Encounter: Payer: Medicaid Other | Admitting: Licensed Clinical Social Worker

## 2023-09-21 ENCOUNTER — Ambulatory Visit (HOSPITAL_COMMUNITY)
Admission: EM | Admit: 2023-09-21 | Discharge: 2023-09-21 | Disposition: A | Discharge status: Home / Self Care | Payer: Medicaid Other

## 2023-09-21 ENCOUNTER — Encounter (HOSPITAL_COMMUNITY): Payer: Self-pay

## 2023-09-21 DIAGNOSIS — K029 Dental caries, unspecified: Secondary | ICD-10-CM | POA: Diagnosis not present

## 2023-09-21 DIAGNOSIS — K0889 Other specified disorders of teeth and supporting structures: Secondary | ICD-10-CM | POA: Diagnosis not present

## 2023-09-21 DIAGNOSIS — K047 Periapical abscess without sinus: Secondary | ICD-10-CM

## 2023-09-21 MED ORDER — AMOXICILLIN-POT CLAVULANATE 875-125 MG PO TABS: 1.0000 | ORAL_TABLET | Freq: Two times a day (BID) | ORAL | 0 refills | Status: DC

## 2023-09-21 NOTE — ED Triage Notes (Signed)
 Pt c/o of lefts sided intermittent dental pain.  Start Date: Around two weeks ago   Home Interventions: Tylenol    Last dose: [place]

## 2023-09-21 NOTE — ED Provider Notes (Signed)
 [facility]    CSN: [id number] Arrival date & time: 09/21/23  1748      History   Chief Complaint Chief Complaint  Patient presents with   Dental Pain    HPI Amy Case is a 38 y.o. female.   HPI  She reports she is having moderate tooth pain  She states the area is painful and she cannot get an apt with her dentist until May She denies swelling, bad taste in her mouth, fevers, chills as of today   She states this has been ongoing for about 2 weeks  She has been using Tylenol  extra strength and this has been providing relief    Past Medical History:  Diagnosis Date   Colitis     Patient Active Problem List   Diagnosis Date Noted   Anxiety 07/03/2023    History reviewed. No pertinent surgical history.  OB History     Gravida  5   Para  2   Term  2   Preterm      AB      Living  2      SAB      IAB      Ectopic      Multiple      Live Births               Home Medications    Prior to Admission medications   Medication Sig Start Date End Date Taking? Authorizing Provider  amoxicillin -clavulanate (AUGMENTIN ) 875-125 MG tablet Take 1 tablet by mouth every 12 (twelve) hours. 09/21/23  Yes Traniya Prichett E, PA-C  tobramycin (TOBREX) 0.3 % ophthalmic solution Apply to eye. 03/06/21  Yes [provider]  metoCLOPramide  (REGLAN ) 10 MG tablet Take 1 tablet (10 mg total) by mouth every 8 (eight) hours as needed for nausea (headache). Patient not taking: Reported on 05/20/2020 12/26/16 05/20/20  Niels Gee, MD    Family History Family History  Problem Relation Age of Onset   Heart attack Mother    Cancer Father    Colon cancer Neg Hx    Pancreatic cancer Neg Hx    Esophageal cancer Neg Hx     Social History Social History   Tobacco Use   Smoking status: Every Day    Current packs/day: 0.50    Types: Cigarettes   Smokeless tobacco: Never  Substance Use Topics   Alcohol use: Yes    Comment: occasionally   Drug  use: No     Allergies   Patient has no known allergies.   Review of Systems Review of Systems  Constitutional:  Negative for chills and fever.  HENT:  Positive for dental problem.      Physical Exam Triage Vital Signs ED Triage Vitals  Encounter Vitals Group     BP 09/21/23 1905 115/75     Systolic BP Percentile --      Diastolic BP Percentile --      Pulse Rate 09/21/23 1905 91     Resp 09/21/23 1905 18     Temp 09/21/23 1905 98.3 F (36.8 C)     Temp Source 09/21/23 1905 Oral     SpO2 09/21/23 1905 95 %     Weight --      Height --      Head Circumference --      Peak Flow --      Pain Score 09/21/23 1903 3     Pain Loc --  Pain Education --      Exclude from Growth Chart --    No data found.  Updated Vital Signs BP 115/75 (BP Location: Left Arm)   Pulse 91   Temp 98.3 F (36.8 C) (Oral)   Resp 18   LMP 09/07/2023 (Approximate)   SpO2 95%   Visual Acuity Right Eye Distance:   Left Eye Distance:   Bilateral Distance:    Right Eye Near:   Left Eye Near:    Bilateral Near:     Physical Exam Vitals reviewed.  Constitutional:      General: She is awake.     Appearance: Normal appearance. She is well-developed and well-groomed.  HENT:     Head: Normocephalic and atraumatic.     Mouth/Throat:     Mouth: Mucous membranes are moist.     Dentition: Abnormal dentition. Dental tenderness and dental caries present. No gingival swelling, dental abscesses or gum lesions.     Pharynx: Oropharynx is clear. Uvula midline. No pharyngeal swelling, oropharyngeal exudate, posterior oropharyngeal erythema, uvula swelling or postnasal drip.   Pulmonary:     Effort: Pulmonary effort is normal.  Neurological:     General: No focal deficit present.     Mental Status: She is alert and oriented to person, place, and time.     GCS: GCS eye subscore is 4. GCS verbal subscore is 5. GCS motor subscore is 6.     Cranial Nerves: No cranial nerve deficit, dysarthria or  facial asymmetry.  Psychiatric:        Behavior: Behavior is cooperative.      UC Treatments / Results  Labs (all labs ordered are listed, but only abnormal results are displayed) Labs Reviewed - No data to display  EKG   Radiology No results found.  Procedures Procedures (including critical care time)  Medications Ordered in UC Medications - No data to display  Initial Impression / Assessment and Plan / UC Course  I have reviewed the triage vital signs and the nursing notes.  Pertinent labs & imaging results that were available during my care of the patient were reviewed by me and considered in my medical decision making (see chart for details).      Final Clinical Impressions(s) / UC Diagnoses   Final diagnoses:  Pain, dental  Infected dental caries   Acute, new concern Patient presents today for dental pain of the left upper molars.  She reports concerns that 1 of these has broken recently.  She states that the pain has been ongoing for about 2 weeks.  At this time I am suspicious for infected dental cavity versus dental abscess.  Will send in Augmentin  for management.  She has an appointment planned in May with her dentist for evaluation.  Reviewed that she will need to monitor her condition until then for worsening symptoms.  Recommend Biotene mouthwash and Sensodyne to assist with discomfort.  Reviewed ED and return precautions.  These were also provided in after visit summary.  Follow-up as needed   Discharge Instructions      At this time I am suspicious that you have an infected dental cavity.  I have sent in a medication called Augmentin  to treat this.  Please take this twice per day for a week.  I also recommend using Biotene mouthwash and using Sensodyne toothpaste to help with discomfort and pain while you are waiting to see your dentist.  I do recommend trying to get in earlier with a dentist  to get this taken care of so that you are not on antibiotics  repeatedly.  If you notice swelling, drainage from the area, difficulty swallowing or opening your mouth please go to the emergency room     ED Prescriptions     Medication Sig Dispense Auth. Provider   amoxicillin -clavulanate (AUGMENTIN ) 875-125 MG tablet Take 1 tablet by mouth every 12 (twelve) hours. 14 tablet Shawnte Demarest E, PA-C      PDMP not reviewed this encounter.   Marylene Rocky BRAVO, PA-C 09/21/23 1949

## 2023-09-21 NOTE — Discharge Instructions (Signed)
 At this time I am suspicious that you have an infected dental cavity.  I have sent in a medication called Augmentin  to treat this.  Please take this twice per day for a week.  I also recommend using Biotene mouthwash and using Sensodyne toothpaste to help with discomfort and pain while you are waiting to see your dentist.  I do recommend trying to get in earlier with a dentist to get this taken care of so that you are not on antibiotics repeatedly.  If you notice swelling, drainage from the area, difficulty swallowing or opening your mouth please go to the emergency room

## 2023-10-25 ENCOUNTER — Ambulatory Visit: Admitting: Radiology

## 2023-11-02 ENCOUNTER — Ambulatory Visit (INDEPENDENT_AMBULATORY_CARE_PROVIDER_SITE_OTHER): Admitting: Radiology

## 2023-11-02 VITALS — BP 108/72 | HR 81 | Temp 98.3°F | Wt 192.0 lb

## 2023-11-02 DIAGNOSIS — B9689 Other specified bacterial agents as the cause of diseases classified elsewhere: Secondary | ICD-10-CM

## 2023-11-02 DIAGNOSIS — R8290 Unspecified abnormal findings in urine: Secondary | ICD-10-CM

## 2023-11-02 DIAGNOSIS — N760 Acute vaginitis: Secondary | ICD-10-CM

## 2023-11-02 DIAGNOSIS — Z7251 High risk heterosexual behavior: Secondary | ICD-10-CM

## 2023-11-02 MED ORDER — METRONIDAZOLE 500 MG PO TABS: 500.0000 mg | ORAL_TABLET | Freq: Two times a day (BID) | ORAL | 0 refills | Status: DC

## 2023-11-02 NOTE — Progress Notes (Signed)
      SUBJECTIVE: Amy Case is a 38 y.o. female who complains of odor in urine, frequency, no dysuria, no urgency. Symptoms began 2 weeks ago after having intercourse. Currently taking amoxicillin for infected tooth. Would like a UPT, started menses this morning.   No Known Allergies  Current Outpatient Medications on File Prior to Visit  Medication Sig Dispense Refill   amoxicillin-clavulanate (AUGMENTIN) 875-125 MG tablet Take 1 tablet by mouth every 12 (twelve) hours. 14 tablet 0   tobramycin (TOBREX) 0.3 % ophthalmic solution Apply to eye. (Patient not taking: Reported on 11/02/2023)     [DISCONTINUED] metoCLOPramide (REGLAN) 10 MG tablet Take 1 tablet (10 mg total) by mouth every 8 (eight) hours as needed for nausea (headache). (Patient not taking: Reported on 05/20/2020) 10 tablet 0   No current facility-administered medications on file prior to visit.    Past Medical History:  Diagnosis Date   Colitis      OBJECTIVE: Appears well, in no apparent distress.  Vital signs are normal. The abdomen is soft without tenderness, guarding, mass, rebound or organomegaly. No CVA tenderness or inguinal adenopathy noted.   Urine dipstick and micro invalid due to large amounts of menstrual blood. Will send for urine culture. Clue cells were present.  Blood pressure 108/72, pulse 81, temperature 98.3 F (36.8 C), temperature source Oral, weight 192 lb (87.1 kg), last menstrual period 11/02/2023, SpO2 96%.    Chaperone offered and declined for exam.  ASSESSMENT/PLAN: 1. Abnormal urine odor (Primary) - Urinalysis,Complete w/RFL Culture  2. Unprotected sexual intercourse - Pregnancy, urine; neg  3. BV (bacterial vaginosis) - metroNIDAZOLE (FLAGYL) 500 MG tablet; Take 1 tablet (500 mg total) by mouth 2 (two) times daily.  Dispense: 14 tablet; Refill: 0    Will send urine culture and sensitivity.  Treatment per orders - also push fluids, avoid bladder irritants. Instructed she may use  Pyridium OTC prn. Call or return to clinic prn if these symptoms worsen or fail to improve as anticipated. Pyelo precautions reviewed with patient.   Naim Murtha B, NP 9:04 AM

## 2023-11-04 LAB — URINALYSIS, COMPLETE W/RFL CULTURE
Glucose, UA: NEGATIVE
Hyaline Cast: NONE SEEN /LPF
Nitrites, Initial: POSITIVE — AB
RBC / HPF: >=60 /HPF — AB (ref 0–2)
Specific Gravity, Urine: 1.03 (ref 1.001–1.035)
pH: 5 (ref 5.0–8.0)

## 2023-11-04 LAB — URINE CULTURE
MICRO NUMBER:: 16231628
Result:: NO GROWTH
SPECIMEN QUALITY:: ADEQUATE

## 2023-11-04 LAB — CULTURE INDICATED

## 2023-11-04 LAB — PREGNANCY, URINE: Preg Test, Ur: NEGATIVE

## 2024-01-01 ENCOUNTER — Emergency Department (HOSPITAL_COMMUNITY)
Admission: EM | Admit: 2024-01-01 | Discharge: 2024-01-01 | Disposition: A | Discharge status: Home / Self Care | Attending: Emergency Medicine | Admitting: Emergency Medicine

## 2024-01-01 ENCOUNTER — Other Ambulatory Visit: Payer: Self-pay

## 2024-01-01 DIAGNOSIS — M79622 Pain in left upper arm: Secondary | ICD-10-CM | POA: Diagnosis present

## 2024-01-01 DIAGNOSIS — M25512 Pain in left shoulder: Secondary | ICD-10-CM | POA: Insufficient documentation

## 2024-01-01 DIAGNOSIS — M79602 Pain in left arm: Secondary | ICD-10-CM

## 2024-01-01 NOTE — Discharge Instructions (Signed)
 You were seen today for left arm pain.  As discussed if pain returns or is associated with chest pain, shortness of breath, feeling unwell or diaphoresis please return to ER for further workup.

## 2024-01-01 NOTE — ED Triage Notes (Signed)
 Patient reports intermittent left upper and left shoulder pain this evening / fingers tingling . Denies injury .

## 2024-01-01 NOTE — ED Provider Notes (Signed)
 [place] EMERGENCY DEPARTMENT AT [facility] Provider Note   CSN: [id number] Arrival date & time: 01/01/24  1946     History  Chief Complaint  Patient presents with   Left upper arm pain     Amy Case is a 38 y.o. female.  Patient without any past medical history is reporting to the emergency room with the complaint of left upper arm and left shoulder pain that started this evening and lasted a few minutes while driving.  Patient reports that she rested her arm and symptoms resolved on their own.  There was some tingling in her fingertips when this happened which has again resolved.  When this happened she had no aphasia, confusion or weakness.  She also denies any chest pain or shortness of breath.  She is not able to move extremity without difficulty and reports 0/10 pain.   HPI     Home Medications Prior to Admission medications   Medication Sig Start Date End Date Taking? Authorizing Provider  amoxicillin -clavulanate (AUGMENTIN ) 875-125 MG tablet Take 1 tablet by mouth every 12 (twelve) hours. 09/21/23   Mecum, Erin E, PA-C  metroNIDAZOLE  (FLAGYL ) 500 MG tablet Take 1 tablet (500 mg total) by mouth 2 (two) times daily. 11/02/23   Chrzanowski, Jami B, NP  tobramycin (TOBREX) 0.3 % ophthalmic solution Apply to eye. Patient not taking: Reported on 11/02/2023 03/06/21   [provider]  metoCLOPramide  (REGLAN ) 10 MG tablet Take 1 tablet (10 mg total) by mouth every 8 (eight) hours as needed for nausea (headache). Patient not taking: Reported on 05/20/2020 12/26/16 05/20/20  Berenice Bream, MD      Allergies    Patient has no known allergies.    Review of Systems   Review of Systems  Musculoskeletal:  Positive for arthralgias.    Physical Exam Updated Vital Signs BP (!) 126/91 (BP Location: Right Arm)   Pulse 75   Temp 98.5 F (36.9 C) (Oral)   Resp 16   SpO2 100%  Physical Exam Vitals and nursing note reviewed.  Constitutional:      General: She  is not in acute distress.    Appearance: She is not toxic-appearing.  HENT:     Head: Normocephalic and atraumatic.  Eyes:     General: No scleral icterus.    Conjunctiva/sclera: Conjunctivae normal.  Cardiovascular:     Rate and Rhythm: Normal rate and regular rhythm.     Pulses: Normal pulses.     Heart sounds: Normal heart sounds.  Pulmonary:     Effort: Pulmonary effort is normal. No respiratory distress.     Breath sounds: Normal breath sounds.  Abdominal:     General: Abdomen is flat. Bowel sounds are normal.     Palpations: Abdomen is soft.     Tenderness: There is no abdominal tenderness.  Musculoskeletal:     Right lower leg: No edema.     Left lower leg: No edema.     Comments: Patient has sensation and strength of upper extremity intact.  Strong radial pulse equal bilaterally good cap refill.  Skin:    General: Skin is warm and dry.     Findings: No lesion.  Neurological:     General: No focal deficit present.     Mental Status: She is alert and oriented to person, place, and time. Mental status is at baseline.     ED Results / Procedures / Treatments   Labs (all labs ordered are listed, but only  abnormal results are displayed) Labs Reviewed - No data to display  EKG None  Radiology No results found.  Procedures Procedures    Medications Ordered in ED Medications - No data to display  ED Course/ Medical Decision Making/ A&P                                 Medical Decision Making  This patient presents to the ED for concern of left shoulder pain, this involves an extensive number of treatment options, and is a complaint that carries with it a high risk of complications and morbidity.  The differential diagnosis includes fracture, dislocation, septic joint, DVT, ACS    Problem List / ED Course / Critical interventions / Medication management  Presenting to emergency room with left upper arm pain.  This lasted a few minutes with driving today.  On  my exam she is hemodynamically stable and well-appearing.  She has nonfocal neurological exam.  Lungs clear to auscultation bilaterally strong radial pulse and symptoms have now resolved.  She had no injury trauma or fall prior to this.  Symptoms do not seem consistent with ACS.  Since she did not have a fall do not think she needs x-ray of this joint.  She is no obvious swelling this I do not suspect this is septic joint.  Given reassuring presentation feel she is appropriate for following up with primary care.  She was given strict return precautions.  Stable for discharge.   Plan  F/u w/ PCP in 2-3d to ensure resolution of sx.  Patient was given return precautions. Patient stable for discharge at this time.  Patient educated on sx/dx and verbalized understanding of plan. Return to ER w/ new or worsening sx.          Final Clinical Impression(s) / ED Diagnoses Final diagnoses:  Left arm pain    Rx / DC Orders ED Discharge Orders     None         Suzanne Erps Allison Ivory 01/01/24 2011    Nicklas Barns, MD 01/04/24 [phone]

## 2024-02-29 ENCOUNTER — Ambulatory Visit: Payer: Self-pay

## 2024-02-29 ENCOUNTER — Ambulatory Visit (HOSPITAL_COMMUNITY)
Admission: RE | Admit: 2024-02-29 | Discharge: 2024-02-29 | Disposition: A | Discharge status: Home / Self Care | Source: Ambulatory Visit | Attending: Family Medicine | Admitting: Family Medicine

## 2024-02-29 ENCOUNTER — Encounter (HOSPITAL_COMMUNITY): Payer: Self-pay

## 2024-02-29 VITALS — BP 133/91 | HR 78 | Temp 99.3°F | Resp 18 | Ht 70.0 in | Wt 170.0 lb

## 2024-02-29 DIAGNOSIS — L03211 Cellulitis of face: Secondary | ICD-10-CM

## 2024-02-29 MED ORDER — AMOXICILLIN-POT CLAVULANATE 875-125 MG PO TABS: 1.0000 | ORAL_TABLET | Freq: Two times a day (BID) | ORAL | 0 refills | Status: AC

## 2024-02-29 MED ORDER — IBUPROFEN 800 MG PO TABS: 800.0000 mg | ORAL_TABLET | Freq: Three times a day (TID) | ORAL | 0 refills | Status: AC | PRN

## 2024-02-29 NOTE — ED Triage Notes (Signed)
 Patient presenting with left upper dental pain onset 1 week ago.  Denies any oral trauma, unsure if broken or infected tooth.  Prescriptions or OTC medications tried: Yes- tylenol  and ibuprofen    with moderate relief in the beginning.

## 2024-02-29 NOTE — Discharge Instructions (Addendum)
 Take amoxicillin -clavulanate 875 mg--1 tab twice daily with food for 7 days  Take ibuprofen 800 mg--1 tab every 8 hours as needed for pain.  You can also take Tylenol /acetaminophen  500 mg--2 every 6 hours as needed for pain along with ibuprofen.  Please call your dentist for an evaluation

## 2024-02-29 NOTE — ED Provider Notes (Signed)
 [facility]    CSN: [id number] Arrival date & time: 02/29/24  1630      History   Chief Complaint Chief Complaint  Patient presents with   Dental Problem   Appointment    HPI Amy Case is a 38 y.o. female.   HPI Here for pain in her left upper and lower teeth, going on for about a week or 2. It comes and goes.  No sores noted in her mouth.   Has tried tylenol  and ibuprofen   No f/c noted; temp here 99.3. No n/v/d.  NKDA  LMP 7/7 Past Medical History:  Diagnosis Date   Colitis     Patient Active Problem List   Diagnosis Date Noted   Anxiety 07/03/2023    History reviewed. No pertinent surgical history.  OB History     Gravida  5   Para  2   Term  2   Preterm      AB      Living  2      SAB      IAB      Ectopic      Multiple      Live Births               Home Medications    Prior to Admission medications   Medication Sig Start Date End Date Taking? Authorizing Provider  amoxicillin -clavulanate (AUGMENTIN ) 875-125 MG tablet Take 1 tablet by mouth 2 (two) times daily for 7 days. 02/29/24 03/07/24 Yes Bosco Paparella K, MD  ibuprofen (ADVIL) 800 MG tablet Take 1 tablet (800 mg total) by mouth every 8 (eight) hours as needed (pain). 02/29/24  Yes Jalene Lacko, Sharlet POUR, MD  metoCLOPramide  (REGLAN ) 10 MG tablet Take 1 tablet (10 mg total) by mouth every 8 (eight) hours as needed for nausea (headache). Patient not taking: Reported on 05/20/2020 12/26/16 05/20/20  Niels Gee, MD    Family History Family History  Problem Relation Age of Onset   Heart attack Mother    Cancer Father    Colon cancer Neg Hx    Pancreatic cancer Neg Hx    Esophageal cancer Neg Hx     Social History Social History   Tobacco Use   Smoking status: Every Day    Current packs/day: 0.50    Types: Cigarettes   Smokeless tobacco: Never  Substance Use Topics   Alcohol use: Yes    Comment: occasionally   Drug use: No     Allergies    Patient has no known allergies.   Review of Systems Review of Systems   Physical Exam Triage Vital Signs ED Triage Vitals  Encounter Vitals Group     BP 02/29/24 1650 (!) 133/91     Girls Systolic BP Percentile --      Girls Diastolic BP Percentile --      Boys Systolic BP Percentile --      Boys Diastolic BP Percentile --      Pulse Rate 02/29/24 1650 78     Resp 02/29/24 1650 18     Temp 02/29/24 1650 99.3 F (37.4 C)     Temp Source 02/29/24 1650 Oral     SpO2 02/29/24 1650 98 %     Weight 02/29/24 1650 170 lb (77.1 kg)     Height 02/29/24 1650 5' 10 (1.778 m)     Head Circumference --      Peak Flow --      Pain Score  02/29/24 1649 1     Pain Loc --      Pain Education --      Exclude from Growth Chart --    No data found.  Updated Vital Signs BP (!) 133/91 (BP Location: Left Arm)   Pulse 78   Temp 99.3 F (37.4 C) (Oral)   Resp 18   Ht 5' 10 (1.778 m)   Wt 77.1 kg   LMP 02/18/2024 (Approximate)   SpO2 98%   BMI 24.39 kg/m   Visual Acuity Right Eye Distance:   Left Eye Distance:   Bilateral Distance:    Right Eye Near:   Left Eye Near:    Bilateral Near:     Physical Exam Vitals reviewed.  Constitutional:      General: She is not in acute distress.    Appearance: She is not toxic-appearing.  HENT:     Mouth/Throat:     Mouth: Mucous membranes are moist.     Pharynx: No oropharyngeal exudate or posterior oropharyngeal erythema.     Comments: There is a little puffiness of her left lower cheek.  No erythema, but it is tender.  There is no visible swelling or drainage inside her mouth. Eyes:     Extraocular Movements: Extraocular movements intact.     Conjunctiva/sclera: Conjunctivae normal.     Pupils: Pupils are equal, round, and reactive to light.  Cardiovascular:     Rate and Rhythm: Normal rate and regular rhythm.     Heart sounds: No murmur heard. Pulmonary:     Effort: Pulmonary effort is normal. No respiratory distress.     Breath  sounds: No stridor. No wheezing, rhonchi or rales.  Musculoskeletal:     Cervical back: Neck supple.  Lymphadenopathy:     Cervical: No cervical adenopathy.  Skin:    Coloration: Skin is not jaundiced or pale.  Neurological:     General: No focal deficit present.     Mental Status: She is alert and oriented to person, place, and time.  Psychiatric:        Behavior: Behavior normal.      UC Treatments / Results  Labs (all labs ordered are listed, but only abnormal results are displayed) Labs Reviewed - No data to display  EKG   Radiology No results found.  Procedures Procedures (including critical care time)  Medications Ordered in UC Medications - No data to display  Initial Impression / Assessment and Plan / UC Course  I have reviewed the triage vital signs and the nursing notes.  Pertinent labs & imaging results that were available during my care of the patient were reviewed by me and considered in my medical decision making (see chart for details).     Augmentin  is sent in for possible dental or oral infection.  She declines my offer of a Toradol  (is not hurting as much at this moment).  Ibuprofen 800 mg is sent in for her to take along with some Tylenol  as needed.  She will call and get in with her dentist Final Clinical Impressions(s) / UC Diagnoses   Final diagnoses:  Cellulitis of cheek     Discharge Instructions      Take amoxicillin -clavulanate 875 mg--1 tab twice daily with food for 7 days  Take ibuprofen 800 mg--1 tab every 8 hours as needed for pain.  You can also take Tylenol /acetaminophen  500 mg--2 every 6 hours as needed for pain along with ibuprofen.  Please call your dentist for an  evaluation     ED Prescriptions     Medication Sig Dispense Auth. Provider   amoxicillin -clavulanate (AUGMENTIN ) 875-125 MG tablet Take 1 tablet by mouth 2 (two) times daily for 7 days. 14 tablet Laira Penninger K, MD   ibuprofen (ADVIL) 800 MG  tablet Take 1 tablet (800 mg total) by mouth every 8 (eight) hours as needed (pain). 21 tablet Damin Salido K, MD      PDMP not reviewed this encounter.   Vonna Sharlet POUR, MD 02/29/24 [phone]

## 2024-06-30 ENCOUNTER — Ambulatory Visit (HOSPITAL_COMMUNITY)
Admission: EM | Admit: 2024-06-30 | Discharge: 2024-06-30 | Disposition: A | Discharge status: Home / Self Care | Attending: Internal Medicine | Admitting: Internal Medicine

## 2024-06-30 ENCOUNTER — Encounter (HOSPITAL_COMMUNITY): Payer: Self-pay | Admitting: Emergency Medicine

## 2024-06-30 DIAGNOSIS — G43109 Migraine with aura, not intractable, without status migrainosus: Secondary | ICD-10-CM

## 2024-06-30 DIAGNOSIS — R030 Elevated blood-pressure reading, without diagnosis of hypertension: Secondary | ICD-10-CM | POA: Diagnosis not present

## 2024-06-30 MED ORDER — KETOROLAC TROMETHAMINE 30 MG/ML IJ SOLN
INTRAMUSCULAR | Status: AC
  Filled 2024-06-30: qty 1

## 2024-06-30 MED ORDER — METOCLOPRAMIDE HCL 5 MG/ML IJ SOLN
5.0000 mg | Freq: Once | INTRAMUSCULAR | Status: AC
  Administered 2024-06-30: 5 mg via INTRAMUSCULAR

## 2024-06-30 MED ORDER — DEXAMETHASONE SOD PHOSPHATE PF 10 MG/ML IJ SOLN
10.0000 mg | Freq: Once | INTRAMUSCULAR | Status: AC
  Administered 2024-06-30: 10 mg via INTRAMUSCULAR

## 2024-06-30 MED ORDER — METOCLOPRAMIDE HCL 5 MG/ML IJ SOLN
INTRAMUSCULAR | Status: AC
  Filled 2024-06-30: qty 2

## 2024-06-30 MED ORDER — KETOROLAC TROMETHAMINE 30 MG/ML IJ SOLN
30.0000 mg | Freq: Once | INTRAMUSCULAR | Status: AC
  Administered 2024-06-30: 30 mg via INTRAMUSCULAR

## 2024-06-30 NOTE — ED Provider Notes (Signed)
 [facility]    CSN: [id number] Arrival date & time: 06/30/24  1607      History   Chief Complaint Chief Complaint  Patient presents with   Hypertension    HPI Amy Case is a 38 y.o. female.   Amy VITIELLO is a 38 y.o. female presenting for chief complaint of malaise, vision change, headache, and elevated blood pressure reading today.  Patient was doing inventory at work when she started feeling off and generally fatigued.  Coworker took her blood pressure at work and it was 140s/100s. She ate some lunch and took a nap which helped a little bit with her generalized fatigue.  She then went to [employer] to shop for work when she had an episode of bilateral blurry vision which resolved and was followed by headache to the left forehead and the left occiput.  She additionally developed mild nausea without vomiting associated with headache.  Headache has been persistent and is currently a 7 on a scale of 0-10.  History of migraine with aura.  Patient states she normally has spots in her vision when she has an aura not blurry vision.  She has never been evaluated by a neurologist for her migraines and denies recent head trauma/injuries.  Denies current vision changes, fever, chills, nausea, vomiting, diarrhea, chest pain, shortness of breath, cough, ear pain, dizziness, tinnitus, unilateral extremity weakness, paresthesias, radicular pain to the spine, changes in gait or speech, and photophobia/phonophobia.  States she does not typically have photophobia or phonophobia with her migraine headaches.   States migraine headaches are usually resolved with use of Tylenol  or ibuprofen  and a nap.  She does not take blood thinning medications and denies history of hypertension.  She has not taken any over-the-counter medications to help with symptoms before arrival today (specifically no NSAIDs).   Hypertension    Past Medical History:  Diagnosis Date   Colitis     Patient  Active Problem List   Diagnosis Date Noted   Anxiety 07/03/2023    History reviewed. No pertinent surgical history.  OB History     Gravida  5   Para  2   Term  2   Preterm      AB      Living  2      SAB      IAB      Ectopic      Multiple      Live Births               Home Medications    Prior to Admission medications   Medication Sig Start Date End Date Taking? Authorizing Provider  ibuprofen  (ADVIL ) 800 MG tablet Take 1 tablet (800 mg total) by mouth every 8 (eight) hours as needed (pain). 02/29/24   Vonna Sharlet POUR, MD  metoCLOPramide  (REGLAN ) 10 MG tablet Take 1 tablet (10 mg total) by mouth every 8 (eight) hours as needed for nausea (headache). Patient not taking: Reported on 05/20/2020 12/26/16 05/20/20  Niels Gee, MD    Family History Family History  Problem Relation Age of Onset   Heart attack Mother    Cancer Father    Colon cancer Neg Hx    Pancreatic cancer Neg Hx    Esophageal cancer Neg Hx     Social History Social History   Tobacco Use   Smoking status: Every Day    Current packs/day: 0.50    Types: Cigarettes   Smokeless tobacco: Never  Substance Use Topics   Alcohol use: Yes    Comment: occasionally   Drug use: No     Allergies   Patient has no known allergies.   Review of Systems Review of Systems Per HPI  Physical Exam Triage Vital Signs ED Triage Vitals  Encounter Vitals Group     BP 06/30/24 1616 (!) 156/108     Girls Systolic BP Percentile --      Girls Diastolic BP Percentile --      Boys Systolic BP Percentile --      Boys Diastolic BP Percentile --      Pulse Rate 06/30/24 1616 69     Resp 06/30/24 1616 16     Temp 06/30/24 1616 98.2 F (36.8 C)     Temp Source 06/30/24 1616 Oral     SpO2 06/30/24 1616 100 %     Weight --      Height --      Head Circumference --      Peak Flow --      Pain Score 06/30/24 1621 7     Pain Loc --      Pain Education --      Exclude from Growth Chart --     No data found.  Updated Vital Signs BP 126/84 (BP Location: Left Arm)   Pulse 62   Temp 98.2 F (36.8 C) (Oral)   Resp 16   SpO2 97%   Visual Acuity Right Eye Distance:   Left Eye Distance:   Bilateral Distance:    Right Eye Near:   Left Eye Near:    Bilateral Near:     Physical Exam Vitals and nursing note reviewed.  Constitutional:      Appearance: She is not ill-appearing or toxic-appearing.  HENT:     Head: Normocephalic and atraumatic.     Right Ear: Hearing, tympanic membrane, ear canal and external ear normal.     Left Ear: Hearing, tympanic membrane, ear canal and external ear normal.     Nose: Nose normal.     Mouth/Throat:     Lips: Pink.     Mouth: Mucous membranes are moist. No injury or oral lesions.     Dentition: Normal dentition.     Tongue: No lesions.     Pharynx: Oropharynx is clear. Uvula midline. No pharyngeal swelling, oropharyngeal exudate, posterior oropharyngeal erythema, uvula swelling or postnasal drip.     Tonsils: No tonsillar exudate.  Eyes:     General: Lids are normal. Vision grossly intact. Gaze aligned appropriately.     Extraocular Movements: Extraocular movements intact.     Conjunctiva/sclera: Conjunctivae normal.  Neck:     Trachea: Trachea and phonation normal.  Cardiovascular:     Rate and Rhythm: Normal rate and regular rhythm.     Heart sounds: Normal heart sounds, S1 normal and S2 normal.  Pulmonary:     Effort: Pulmonary effort is normal. No respiratory distress.     Breath sounds: Normal breath sounds and air entry.  Musculoskeletal:     Cervical back: Neck supple.  Lymphadenopathy:     Cervical: No cervical adenopathy.  Skin:    General: Skin is warm and dry.     Capillary Refill: Capillary refill takes less than 2 seconds.     Findings: No rash.  Neurological:     General: No focal deficit present.     Mental Status: She is alert and oriented to person, place, and time. Mental status  is at baseline.     GCS:  GCS eye subscore is 4. GCS verbal subscore is 5. GCS motor subscore is 6.     Cranial Nerves: Cranial nerves 2-12 are intact. No dysarthria or facial asymmetry.     Sensory: Sensation is intact.     Motor: Motor function is intact. No weakness, tremor, abnormal muscle tone or pronator drift.     Coordination: Coordination is intact. Romberg sign negative. Coordination normal. Finger-Nose-Finger Test normal.     Gait: Gait is intact.     Comments: Strength and sensation intact to bilateral upper and lower extremities (5/5). Moves all 4 extremities with normal coordination voluntarily. Non-focal neuro exam.   Psychiatric:        Attention and Perception: Attention normal.        Mood and Affect: Mood is anxious. Affect is tearful.        Speech: Speech normal.        Behavior: Behavior normal.        Thought Content: Thought content normal.        Judgment: Judgment normal.      UC Treatments / Results  Labs (all labs ordered are listed, but only abnormal results are displayed) Labs Reviewed - No data to display  EKG   Radiology No results found.  Procedures Procedures (including critical care time)  Medications Ordered in UC Medications  ketorolac  (TORADOL ) 30 MG/ML injection 30 mg (30 mg Intramuscular Given 06/30/24 1735)  metoCLOPramide  (REGLAN ) injection 5 mg (5 mg Intramuscular Given 06/30/24 1744)  dexamethasone (DECADRON) injection 10 mg (10 mg Intramuscular Given 06/30/24 1740)    Initial Impression / Assessment and Plan / UC Course  I have reviewed the triage vital signs and the nursing notes.  Pertinent labs & imaging results that were available during my care of the patient were reviewed by me and considered in my medical decision making (see chart for details).   1.  Migraine with aura without status migrainosus not intractable, elevated blood pressure reading in office without diagnosis of hypertension Evaluation suggests symptoms are due to migraine with aura.   Neurologic exam without focal deficit, patient intact to baseline.  Nursing administered headache cocktail in clinic for headache relief with improvement prior to discharge.  Migraine reduced to a 2 on a scale of 0-10 after headache cocktail. No NSAIDs until tomorrow due to medications given in clinic. May use OTC medications as needed for further head pain relief.  Encouraged to drink plenty of fluids to stay well hydrated. Avoid migraine triggers and keep headache diary. Follow-up with PCP for further evaluation of persistent headaches.  Counseled patient on potential for adverse effects with medications prescribed/recommended today, strict ER and return-to-clinic precautions discussed, patient verbalized understanding.    Final Clinical Impressions(s) / UC Diagnoses   Final diagnoses:  Elevated blood pressure reading in office without diagnosis of hypertension  Migraine with aura and without status migrainosus, not intractable     Discharge Instructions      You have been evaluated today for headache.  You were given medicines for your headache in the clinic today which included a strong NSAID, so do not  take ibuprofen  or other NSAIDS (Aleve , aspirin, naproxen , ibuprofen , goody powder, etc.) for the next 12 hours.  Starting tomorrow, take 600mg  ibuprofen  every 6 hours or tylenol  1,000 every 6 hours as needed for pain.  Avoid areas of loud noise/harsh light and remember to drink plenty of water to stay well hydrated.  Please follow up with your primary care provider for further management of your headaches.  Please seek emergency medical care if you experience worsening or uncontrolled pain, vision changes, recurrent vomiting, difficulty with normal activities, abnormal behavior, difficulty walking, numbness, weakness, or any other concerning symptoms.       ED Prescriptions   None    PDMP not reviewed this encounter.   Enedelia Dorna HERO, [place] 06/30/24 1826

## 2024-06-30 NOTE — ED Triage Notes (Signed)
 Pt st's she was at work today and started not feeling good  Pt st's she started having blurry vision and a slight frontal headache.  Pt denies hx of HTN

## 2024-06-30 NOTE — Discharge Instructions (Addendum)
